# Patient Record
Sex: Female | Born: 1955 | Race: White | Hispanic: No | Marital: Married | Smoking: Former smoker
Health system: Southern US, Community
[De-identification: ages and names within clinical notes are randomized; demographics above are authoritative.]

## PROBLEM LIST (undated history)

## (undated) DIAGNOSIS — I219 Acute myocardial infarction, unspecified: Secondary | ICD-10-CM

## (undated) DIAGNOSIS — F419 Anxiety disorder, unspecified: Secondary | ICD-10-CM

## (undated) DIAGNOSIS — F329 Major depressive disorder, single episode, unspecified: Secondary | ICD-10-CM

## (undated) DIAGNOSIS — F32A Depression, unspecified: Secondary | ICD-10-CM

## (undated) HISTORY — PX: ANKLE SURGERY: SHX546

## (undated) HISTORY — PX: EYE SURGERY: SHX253

## (undated) HISTORY — PX: TONSILLECTOMY: SUR1361

## (undated) HISTORY — PX: TUBAL LIGATION: SHX77

---
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
## 2002-03-14 ENCOUNTER — Encounter: Admission: RE | Admit: 2002-03-14 | Discharge: 2002-03-14 | Payer: Self-pay

## 2002-10-08 ENCOUNTER — Encounter: Admission: RE | Admit: 2002-10-08 | Discharge: 2002-10-08 | Payer: Self-pay | Admitting: Family Medicine

## 2002-10-08 ENCOUNTER — Encounter: Payer: Self-pay | Admitting: Family Medicine

## 2005-12-14 ENCOUNTER — Encounter: Admission: RE | Admit: 2005-12-14 | Discharge: 2005-12-14 | Payer: Self-pay | Admitting: Sports Medicine

## 2006-05-11 ENCOUNTER — Encounter: Admission: RE | Admit: 2006-05-11 | Discharge: 2006-05-11 | Payer: Self-pay | Admitting: Sports Medicine

## 2007-07-01 ENCOUNTER — Emergency Department (HOSPITAL_COMMUNITY): Admission: EM | Admit: 2007-07-01 | Discharge: 2007-07-01 | Payer: Self-pay | Admitting: Emergency Medicine

## 2012-08-30 DIAGNOSIS — R9431 Abnormal electrocardiogram [ECG] [EKG]: Secondary | ICD-10-CM | POA: Insufficient documentation

## 2012-12-08 DIAGNOSIS — M5030 Other cervical disc degeneration, unspecified cervical region: Secondary | ICD-10-CM | POA: Insufficient documentation

## 2013-03-19 DIAGNOSIS — IMO0002 Reserved for concepts with insufficient information to code with codable children: Secondary | ICD-10-CM | POA: Insufficient documentation

## 2013-07-23 DIAGNOSIS — M810 Age-related osteoporosis without current pathological fracture: Secondary | ICD-10-CM | POA: Insufficient documentation

## 2013-09-18 DIAGNOSIS — D472 Monoclonal gammopathy: Secondary | ICD-10-CM | POA: Insufficient documentation

## 2013-10-19 DIAGNOSIS — K760 Fatty (change of) liver, not elsewhere classified: Secondary | ICD-10-CM | POA: Insufficient documentation

## 2013-10-19 DIAGNOSIS — R911 Solitary pulmonary nodule: Secondary | ICD-10-CM | POA: Insufficient documentation

## 2013-10-19 DIAGNOSIS — N281 Cyst of kidney, acquired: Secondary | ICD-10-CM | POA: Insufficient documentation

## 2015-01-12 DIAGNOSIS — H2589 Other age-related cataract: Secondary | ICD-10-CM | POA: Insufficient documentation

## 2015-04-18 ENCOUNTER — Emergency Department
Admission: EM | Admit: 2015-04-18 | Discharge: 2015-04-18 | Disposition: A | Discharge status: Home / Self Care | Payer: BLUE CROSS/BLUE SHIELD | Source: Home / Self Care | Attending: Family Medicine | Admitting: Family Medicine

## 2015-04-18 ENCOUNTER — Encounter: Payer: Self-pay | Admitting: Emergency Medicine

## 2015-04-18 ENCOUNTER — Emergency Department (INDEPENDENT_AMBULATORY_CARE_PROVIDER_SITE_OTHER): Payer: BLUE CROSS/BLUE SHIELD

## 2015-04-18 DIAGNOSIS — S4991XA Unspecified injury of right shoulder and upper arm, initial encounter: Secondary | ICD-10-CM | POA: Diagnosis not present

## 2015-04-18 DIAGNOSIS — M25511 Pain in right shoulder: Secondary | ICD-10-CM

## 2015-04-18 DIAGNOSIS — M75101 Unspecified rotator cuff tear or rupture of right shoulder, not specified as traumatic: Secondary | ICD-10-CM

## 2015-04-18 NOTE — ED Notes (Signed)
Pt states that she fell last night after dinner on her right shoulder.  Pt does have a torn rotator cuff which she is scheduled for surgery on Tuesday for w/Dr Supple.

## 2015-04-18 NOTE — ED Provider Notes (Signed)
CSN: [id number]     Arrival date & time 04/18/15  1400 History   First MD Initiated Contact with Patient 04/18/15 1418     Chief Complaint  Patient presents with  . Shoulder Pain   (Consider location/radiation/quality/duration/timing/severity/associated sxs/prior Treatment) HPI  The pt is a 60yo female presenting to [facility], accompanied by her daughter, with c/o severe Right shoulder pain and deformity after she slipped and fell last night while out at dinner. She believes she slipped on some grease as her feet slid out from under her before she could catch her fall.  Pain is severe, sharp aching, 10/10, significantly decreased ROM. She has been taking ibuprofen, acetaminophen and tramadol but no relief of the pain.  She does have mild numbness along her forearm but believes that is due to the RCT. Denies hitting her head but does have mild Right sided neck pain she believes is radiating from her shoulder.  She reports hx of current torn rotator cuff and is scheduled for surgery on Tuesday, 04/20/15 with Dr. Onnie Graham.   Pt notes she has a number for Dr. Tonita Cong, [facility], who she can call if needed while in [place].   History reviewed. No pertinent past medical history. Past Surgical History  Procedure Laterality Date  . Tubal ligation    . Ankle surgery    . Tonsillectomy    . Eye surgery     Family History  Problem Relation Age of Onset  . Osteoporosis Mother   . Rheum arthritis Mother   . Multiple sclerosis Father   . Heart Problems Brother   . Diabetes Brother    Social History  Substance Use Topics  . Smoking status: Former Research scientist (life sciences)  . Smokeless tobacco: None  . Alcohol Use: No   OB History    No data available     Review of Systems  Musculoskeletal: Positive for myalgias, joint swelling, arthralgias and neck pain (Right side). Negative for gait problem and neck stiffness.  Skin: Negative for color change and wound.  Neurological: Positive for weakness and numbness.     Allergies  Codeine  Home Medications   Prior to Admission medications   Medication Sig Start Date End Date Taking? Authorizing Provider  acetaminophen (TYLENOL) 650 MG CR tablet Take 650 mg by mouth every 8 (eight) hours as needed for pain.   Yes Historical Provider, MD  amitriptyline (ELAVIL) 25 MG tablet Take 25 mg by mouth at bedtime.   Yes Historical Provider, MD  FLUoxetine (PROZAC) 40 MG capsule Take 40 mg by mouth daily.   Yes Historical Provider, MD  hydrochlorothiazide (HYDRODIURIL) 25 MG tablet Take 25 mg by mouth daily.   Yes Historical Provider, MD  traMADol (ULTRAM) 50 MG tablet Take by mouth every 6 (six) hours as needed.   Yes Historical Provider, MD   Meds Ordered and Administered this Visit  Medications - No data to display  BP 144/68 mmHg  Pulse 68  Temp(Src) 98.3 F (36.8 C) (Oral)  Ht 5\' 4"  (1.626 m)  Wt 152 lb 8 oz (69.174 kg)  BMI 26.16 kg/m2  SpO2 91% No data found.   Physical Exam  Constitutional: She is oriented to person, place, and time. She appears well-developed and well-nourished.  HENT:  Head: Normocephalic and atraumatic.  Eyes: EOM are normal.  Neck: Normal range of motion.  Cardiovascular: Normal rate.   Pulses:      Radial pulses are 2+ on the right side.  Pulmonary/Chest: Effort normal.  Musculoskeletal: She  exhibits edema and tenderness.  Right shoulder: anterior deformity/humeral head prominence. Limited ROM. Unable to abduct or adduct her arm more than 5 degrees both directions due to severe pain.   Right elbow: non-tender, full ROM.  4/5 grip strength Right hand compared to Left.   Neurological: She is alert and oriented to person, place, and time.  Skin: Skin is warm and dry.  Right arm: skin in tact. No ecchymosis or erythema.   Psychiatric: She has a normal mood and affect. Her behavior is normal.  Nursing note and vitals reviewed.   ED Course  Procedures (including critical care time)  Labs Review Labs Reviewed - No  data to display  Imaging Review Dg Shoulder Right  04/18/2015  CLINICAL DATA:  Post fall last evening injuring right shoulder. EXAM: RIGHT SHOULDER - 2+ VIEW COMPARISON:  None. FINDINGS: No fracture or dislocation. Glenohumeral and acromioclavicular joint spaces appear preserved. No evidence of calcific tendinitis. Regional soft tissues appear normal. Limited visualization adjacent thorax is normal. IMPRESSION: Normal radiographs of the right shoulder. Electronically Signed   By: Sandi Mariscal M.D.   On: 04/18/2015 14:58      MDM   1. Right shoulder injury, initial encounter   2. Rotator cuff tear, right    Pt c/o Right shoulder pain after a slip and fall last night. 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 No head injury. Exam concerning for anterior shoulder dislocation  Plain films: no signs of dislocation or fracture. Reassured pt.  Pt requesting pain medication stronger than tramadol.  Offered a few days of vicodin or percocet, pt declined stating she cannot have anything with codeine.   She does recall she has Flexeril at home she has not tried yet. Encouraged to try Flexeril and take tramadol as prescribed. May call her orthopedist for additional guidance.  Sling provided in UC.     Noland Fordyce, PA-C 04/18/15 1515

## 2015-04-18 NOTE — Discharge Instructions (Signed)
You may take your tramadol and Flexeril (muscle relaxer) as prescribed to help with pain.  Please be sure to follow up with your orthopedist if additional pain medication is needed.  You may alternate ice and heat such as a heating pad or warm washcloth to help with sore muscles.  Rotator Cuff Injury Rotator cuff injury is any type of injury to the set of muscles and tendons that make up the stabilizing unit of your shoulder. This unit holds the ball of your upper arm bone (humerus) in the socket of your shoulder blade (scapula).  CAUSES Injuries to your rotator cuff most commonly come from sports or activities that cause your arm to be moved repeatedly over your head. Examples of this include throwing, weight lifting, swimming, or racquet sports. Long lasting (chronic) irritation of your rotator cuff can cause soreness and swelling (inflammation), bursitis, and eventual damage to your tendons, such as a tear (rupture). SIGNS AND SYMPTOMS Acute rotator cuff tear:  Sudden tearing sensation followed by severe pain shooting from your upper shoulder down your arm toward your elbow.  Decreased range of motion of your shoulder because of pain and muscle spasm.  Severe pain.  Inability to raise your arm out to the side because of pain and loss of muscle power (large tears). Chronic rotator cuff tear:  Pain that usually is worse at night and may interfere with sleep.  Gradual weakness and decreased shoulder motion as the pain worsens.  Decreased range of motion. Rotator cuff tendinitis:  Deep ache in your shoulder and the outside upper arm over your shoulder.  Pain that comes on gradually and becomes worse when lifting your arm to the side or turning it inward. DIAGNOSIS Rotator cuff injury is diagnosed through a medical history, physical exam, and imaging exam. The medical history helps determine the type of rotator cuff injury. Your health care provider will look at your injured shoulder,  feel the injured area, and ask you to move your shoulder in different positions. X-ray exams typically are done to rule out other causes of shoulder pain, such as fractures. MRI is the exam of choice for the most severe shoulder injuries because the images show muscles and tendons.  TREATMENT  Chronic tear:  Medicine for pain, such as acetaminophen or ibuprofen.  Physical therapy and range-of-motion exercises may be helpful in maintaining shoulder function and strength.  Steroid injections into your shoulder joint.  Surgical repair of the rotator cuff if the injury does not heal with noninvasive treatment. Acute tear:  Anti-inflammatory medicines such as ibuprofen and naproxen to help reduce pain and swelling.  A sling to help support your arm and rest your rotator cuff muscles. Long-term use of a sling is not advised. It may cause significant stiffening of the shoulder joint.  Surgery may be considered within a few weeks, especially in younger, active people, to return the shoulder to full function.  Indications for surgical treatment include the following:  Age younger than 110 years.  Rotator cuff tears that are complete.  Physical therapy, rest, and anti-inflammatory medicines have been used for 6-8 weeks, with no improvement.  Employment or sporting activity that requires constant shoulder use. Tendinitis:  Anti-inflammatory medicines such as ibuprofen and naproxen to help reduce pain and swelling.  A sling to help support your arm and rest your rotator cuff muscles. Long-term use of a sling is not advised. It may cause significant stiffening of the shoulder joint.  Severe tendinitis may require:  Steroid injections  into your shoulder joint.  Physical therapy.  Surgery. HOME CARE INSTRUCTIONS   Apply ice to your injury:  Put ice in a plastic bag.  Place a towel between your skin and the bag.  Leave the ice on for 20 minutes, 2-3 times a day.  If you have a  shoulder immobilizer (sling and straps), wear it until told otherwise by your health care provider.  You may want to sleep on several pillows or in a recliner at night to lessen swelling and pain.  Only take over-the-counter or prescription medicines for pain, discomfort, or fever as directed by your health care provider.  Do simple hand squeezing exercises with a soft rubber ball to decrease hand swelling. SEEK MEDICAL CARE IF:   Your shoulder pain increases, or new pain or numbness develops in your arm, hand, or fingers.  Your hand or fingers are colder than your other hand. SEEK IMMEDIATE MEDICAL CARE IF:   Your arm, hand, or fingers are numb or tingling.  Your arm, hand, or fingers are increasingly swollen and painful, or they turn white or blue. MAKE SURE YOU:  Understand these instructions.  Will watch your condition.  Will get help right away if you are not doing well or get worse.   This information is not intended to replace advice given to you by your health care provider. Make sure you discuss any questions you have with your health care provider.   Document Released: 02/18/2000 Document Revised: 02/25/2013 Document Reviewed: 10/02/2012 Elsevier Interactive Patient Education 2016 [employer].  How to Use a Shoulder Immobilizer A shoulder immobilizer is a device that you may have to wear after a shoulder injury or surgery. This device keeps your arm from moving. This prevents additional pain or injury. It also supports your arm next to your body as your shoulder heals. You may need to wear a shoulder immobilizer to treat a broken bone (fracture) in your shoulder. You may also need to wear one if you have an injury that moves your shoulder out of position (dislocation). There are different types of shoulder immobilizers. The one that you get depends on your injury. RISKS AND COMPLICATIONS Wearing a shoulder immobilizer in the wrong way can let your injured shoulder move  around too much. This may delay healing and make your pain and swelling worse. HOW TO USE YOUR SHOULDER IMMOBILIZER  The part of the immobilizer that goes around your neck (sling) should support your upper arm, with your elbow bent and your lower arm and hand across your chest.  Make sure that your elbow:  Is snug against the back pocket of the sling.  Does not move away from your body.  The strap of the immobilizer should go over your shoulder and support your arm and hand. Your hand should be slightly higher than your elbow. It should not hang loosely over the edge of the sling.  If the long strap has a pad, place it where it is most comfortable on your neck.  Carefully follow your health care provider's instructions for wearing your shoulder immobilizer. Your health care provider may want you to:  Loosen your immobilizer to straighten your elbow and move your wrist and fingers. You may have to do this several times each day. Ask your health care provider when you should do this and how often.  Remove your immobilizer once every day to shower, but limit the movement in your injured arm. Before putting the immobilizer back on, use a towel  to dry the area under your arm completely.  Remove your immobilizer to do shoulder exercises at home as directed by your health care provider.  Wear your immobilizer while you sleep. You may sleep more comfortably if you have your upper body raised on pillows. SEEK MEDICAL CARE IF:  Your immobilizer is not supporting your arm properly.  Your immobilizer gets damaged.  You have worsening pain or swelling in your shoulder, arm, or hand.  Your shoulder, arm, or hand changes color or temperature.  You lose feeling in your shoulder, arm, or hand.   This information is not intended to replace advice given to you by your health care provider. Make sure you discuss any questions you have with your health care provider.   Document Released: 03/30/2004  Document Revised: 07/07/2014 Document Reviewed: 01/28/2014 Elsevier Interactive Patient Education [employer].

## 2016-12-12 DIAGNOSIS — F330 Major depressive disorder, recurrent, mild: Secondary | ICD-10-CM | POA: Insufficient documentation

## 2016-12-12 DIAGNOSIS — E559 Vitamin D deficiency, unspecified: Secondary | ICD-10-CM | POA: Insufficient documentation

## 2016-12-12 DIAGNOSIS — G2581 Restless legs syndrome: Secondary | ICD-10-CM | POA: Insufficient documentation

## 2016-12-12 DIAGNOSIS — F411 Generalized anxiety disorder: Secondary | ICD-10-CM | POA: Insufficient documentation

## 2017-01-18 ENCOUNTER — Ambulatory Visit (INDEPENDENT_AMBULATORY_CARE_PROVIDER_SITE_OTHER): Payer: BLUE CROSS/BLUE SHIELD

## 2017-01-18 ENCOUNTER — Ambulatory Visit: Payer: BLUE CROSS/BLUE SHIELD | Admitting: Podiatry

## 2017-01-18 ENCOUNTER — Other Ambulatory Visit: Payer: Self-pay

## 2017-01-18 DIAGNOSIS — M21619 Bunion of unspecified foot: Secondary | ICD-10-CM

## 2017-01-18 DIAGNOSIS — M2010 Hallux valgus (acquired), unspecified foot: Secondary | ICD-10-CM | POA: Diagnosis not present

## 2017-01-18 DIAGNOSIS — M722 Plantar fascial fibromatosis: Secondary | ICD-10-CM

## 2017-01-18 NOTE — Patient Instructions (Signed)

## 2017-02-08 ENCOUNTER — Ambulatory Visit: Payer: BLUE CROSS/BLUE SHIELD | Admitting: Podiatry

## 2017-03-07 ENCOUNTER — Other Ambulatory Visit: Payer: Self-pay | Admitting: Podiatry

## 2017-03-07 DIAGNOSIS — M722 Plantar fascial fibromatosis: Secondary | ICD-10-CM

## 2017-03-25 NOTE — Progress Notes (Signed)
  Subjective:  Patient ID: Stacy Sweeney, female    DOB: 1955-12-03,  MRN: [id number]  Chief Complaint  Patient presents with  . Foot Pain    left foot, bottom of heel x 6 months. Right, lateral side, pain radiates up ankle.  . Bunions    B/L - left foot hurts   . Ankle Pain    Left, medial side.  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    62 y.o. female presents with the above complaint.  Reports pain to the bottom of the L heel. Worst in AM. Also reports pain to the outside of R foot, radiates up the lateral side. Reports painful bunion bilat. 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 No past medical history on file. Past Surgical History:  Procedure Laterality Date  . ANKLE SURGERY    . EYE SURGERY    . TONSILLECTOMY    . TUBAL LIGATION      Current Outpatient Medications:  .  acetaminophen (TYLENOL) 650 MG CR tablet, Take 650 mg by mouth every 8 (eight) hours as needed for pain., Disp: , Rfl:  .  amitriptyline (ELAVIL) 25 MG tablet, Take 25 mg by mouth at bedtime., Disp: , Rfl:  .  FLUoxetine (PROZAC) 40 MG capsule, Take 40 mg by mouth daily., Disp: , Rfl:  .  hydrochlorothiazide (HYDRODIURIL) 25 MG tablet, Take 25 mg by mouth daily., Disp: , Rfl:  .  traMADol (ULTRAM) 50 MG tablet, Take by mouth every 6 (six) hours as needed., Disp: , Rfl:   Allergies  Allergen Reactions  . Codeine    Review of Systems Objective:  There were no vitals filed for this visit. General AA&O x3. Normal mood and affect.  Vascular Dorsalis pedis and posterior tibial pulses  present 2+ bilaterally  Capillary refill normal to all digits. Pedal hair growth normal.  Neurologic Epicritic sensation grossly present.  Dermatologic No open lesions. Interspaces clear of maceration. Nails well groomed and normal in appearance.  Orthopedic: MMT 5/5 in dorsiflexion, plantarflexion, inversion, and eversion. Normal joint ROM without pain or crepitus. POP L medial calcaneal  tuber. No pain with calcaneal squeeze.  Radiograps taken and reviwed. No acute fractures or dislocations. HAV deformity. Calcaneal spurring ntoed. Assessment & Plan:  Patient was evaluated and treated and all questions answered.  Plantar Fasciitis, left - XR reviewed as above.  - Educated on icing and stretching. Instructions given.  - Injection delivered to the plantar fascia as below.  Procedure: Injection Tendon/Ligament Location: Left plantar fascia at the glabrous junction; medial approach. Skin Prep: Alcohol. Injectate: 1 cc 0.5% marcaine plain, 1 cc dexamethasone phosphate, 0.5 cc kenalog 10. Disposition: Patient tolerated procedure well. Injection site dressed with a band-aid.  Bunions B/l -Educated on padding, shoe gear changes.  Return in about 3 weeks (around 02/08/2017).

## 2017-04-02 ENCOUNTER — Ambulatory Visit: Payer: BLUE CROSS/BLUE SHIELD | Admitting: Podiatry

## 2018-09-06 ENCOUNTER — Emergency Department (HOSPITAL_BASED_OUTPATIENT_CLINIC_OR_DEPARTMENT_OTHER): Payer: BLUE CROSS/BLUE SHIELD

## 2018-09-06 ENCOUNTER — Inpatient Hospital Stay (HOSPITAL_BASED_OUTPATIENT_CLINIC_OR_DEPARTMENT_OTHER)
Admission: EM | Admit: 2018-09-06 | Discharge: 2018-09-08 | DRG: 313 | Disposition: A | Discharge status: Home / Self Care | Payer: BLUE CROSS/BLUE SHIELD | Attending: Internal Medicine | Admitting: Internal Medicine

## 2018-09-06 ENCOUNTER — Other Ambulatory Visit: Payer: Self-pay

## 2018-09-06 ENCOUNTER — Encounter (HOSPITAL_BASED_OUTPATIENT_CLINIC_OR_DEPARTMENT_OTHER): Payer: Self-pay

## 2018-09-06 DIAGNOSIS — D472 Monoclonal gammopathy: Secondary | ICD-10-CM | POA: Diagnosis present

## 2018-09-06 DIAGNOSIS — R918 Other nonspecific abnormal finding of lung field: Secondary | ICD-10-CM | POA: Diagnosis present

## 2018-09-06 DIAGNOSIS — Z8261 Family history of arthritis: Secondary | ICD-10-CM

## 2018-09-06 DIAGNOSIS — Z8249 Family history of ischemic heart disease and other diseases of the circulatory system: Secondary | ICD-10-CM | POA: Diagnosis not present

## 2018-09-06 DIAGNOSIS — F411 Generalized anxiety disorder: Secondary | ICD-10-CM | POA: Diagnosis present

## 2018-09-06 DIAGNOSIS — R0789 Other chest pain: Principal | ICD-10-CM | POA: Diagnosis present

## 2018-09-06 DIAGNOSIS — I248 Other forms of acute ischemic heart disease: Secondary | ICD-10-CM | POA: Diagnosis present

## 2018-09-06 DIAGNOSIS — N2889 Other specified disorders of kidney and ureter: Secondary | ICD-10-CM

## 2018-09-06 DIAGNOSIS — R519 Headache, unspecified: Secondary | ICD-10-CM

## 2018-09-06 DIAGNOSIS — R739 Hyperglycemia, unspecified: Secondary | ICD-10-CM | POA: Diagnosis present

## 2018-09-06 DIAGNOSIS — R51 Headache: Secondary | ICD-10-CM | POA: Diagnosis present

## 2018-09-06 DIAGNOSIS — Z20828 Contact with and (suspected) exposure to other viral communicable diseases: Secondary | ICD-10-CM | POA: Diagnosis present

## 2018-09-06 DIAGNOSIS — Z8262 Family history of osteoporosis: Secondary | ICD-10-CM

## 2018-09-06 DIAGNOSIS — I214 Non-ST elevation (NSTEMI) myocardial infarction: Secondary | ICD-10-CM

## 2018-09-06 DIAGNOSIS — R079 Chest pain, unspecified: Secondary | ICD-10-CM

## 2018-09-06 DIAGNOSIS — I10 Essential (primary) hypertension: Secondary | ICD-10-CM | POA: Diagnosis present

## 2018-09-06 DIAGNOSIS — Z87891 Personal history of nicotine dependence: Secondary | ICD-10-CM | POA: Diagnosis not present

## 2018-09-06 DIAGNOSIS — I2510 Atherosclerotic heart disease of native coronary artery without angina pectoris: Secondary | ICD-10-CM | POA: Diagnosis present

## 2018-09-06 DIAGNOSIS — Z833 Family history of diabetes mellitus: Secondary | ICD-10-CM

## 2018-09-06 DIAGNOSIS — R9431 Abnormal electrocardiogram [ECG] [EKG]: Secondary | ICD-10-CM | POA: Diagnosis present

## 2018-09-06 DIAGNOSIS — I361 Nonrheumatic tricuspid (valve) insufficiency: Secondary | ICD-10-CM | POA: Diagnosis not present

## 2018-09-06 DIAGNOSIS — R0602 Shortness of breath: Secondary | ICD-10-CM

## 2018-09-06 DIAGNOSIS — R778 Other specified abnormalities of plasma proteins: Secondary | ICD-10-CM

## 2018-09-06 DIAGNOSIS — R7989 Other specified abnormal findings of blood chemistry: Secondary | ICD-10-CM | POA: Diagnosis not present

## 2018-09-06 DIAGNOSIS — F329 Major depressive disorder, single episode, unspecified: Secondary | ICD-10-CM | POA: Diagnosis present

## 2018-09-06 DIAGNOSIS — Z820 Family history of epilepsy and other diseases of the nervous system: Secondary | ICD-10-CM | POA: Diagnosis not present

## 2018-09-06 HISTORY — DX: Anxiety disorder, unspecified: F41.9

## 2018-09-06 HISTORY — DX: Depression, unspecified: F32.A

## 2018-09-06 LAB — TROPONIN I (HIGH SENSITIVITY)
Troponin I (High Sensitivity): 36 ng/L — ABNORMAL HIGH (ref <18)
Troponin I (High Sensitivity): 61 ng/L — ABNORMAL HIGH (ref <18)

## 2018-09-06 LAB — CBC WITH DIFFERENTIAL/PLATELET
Abs Immature Granulocytes: 0.03 10*3/uL (ref 0.00–0.07)
Basophils Absolute: 0.1 10*3/uL (ref 0.0–0.1)
Basophils Relative: 1 %
Eosinophils Absolute: 0.2 10*3/uL (ref 0.0–0.5)
Eosinophils Relative: 2 %
HCT: 37 % (ref 36.0–46.0)
Hemoglobin: 12 g/dL (ref 12.0–15.0)
Immature Granulocytes: 0 %
Lymphocytes Relative: 30 %
Lymphs Abs: 2.8 10*3/uL (ref 0.7–4.0)
MCH: 28.6 pg (ref 26.0–34.0)
MCHC: 32.4 g/dL (ref 30.0–36.0)
MCV: 88.3 fL (ref 80.0–100.0)
Monocytes Absolute: 1.1 10*3/uL — ABNORMAL HIGH (ref 0.1–1.0)
Monocytes Relative: 11 %
Neutro Abs: 5.2 10*3/uL (ref 1.7–7.7)
Neutrophils Relative %: 56 %
Platelets: 377 10*3/uL (ref 150–400)
RBC: 4.19 MIL/uL (ref 3.87–5.11)
RDW: 12.4 % (ref 11.5–15.5)
WBC: 9.3 10*3/uL (ref 4.0–10.5)
nRBC: 0 % (ref 0.0–0.2)

## 2018-09-06 LAB — BASIC METABOLIC PANEL
Anion gap: 14 (ref 5–15)
BUN: 19 mg/dL (ref 8–23)
CO2: 22 mmol/L (ref 22–32)
Calcium: 9.8 mg/dL (ref 8.9–10.3)
Chloride: 107 mmol/L (ref 98–111)
Creatinine, Ser: 0.77 mg/dL (ref 0.44–1.00)
GFR calc Af Amer: >60 mL/min (ref >60)
GFR calc non Af Amer: >60 mL/min (ref >60)
Glucose, Bld: 104 mg/dL — ABNORMAL HIGH (ref 70–99)
Potassium: 4.1 mmol/L (ref 3.5–5.1)
Sodium: 143 mmol/L (ref 135–145)

## 2018-09-06 LAB — SARS CORONAVIRUS 2 AG (30 MIN TAT): SARS Coronavirus 2 Ag: NEGATIVE

## 2018-09-06 MED ORDER — ENOXAPARIN SODIUM 80 MG/0.8ML ~~LOC~~ SOLN
1.0000 mg/kg | Freq: Once | SUBCUTANEOUS | Status: AC
  Administered 2018-09-06: 65 mg via SUBCUTANEOUS
  Filled 2018-09-06: qty 0.8

## 2018-09-06 MED ORDER — NITROGLYCERIN 0.4 MG SL SUBL
0.4000 mg | SUBLINGUAL_TABLET | SUBLINGUAL | Status: DC | PRN
  Administered 2018-09-07: 0.4 mg via SUBLINGUAL
  Filled 2018-09-06: qty 1

## 2018-09-06 MED ORDER — ASPIRIN 81 MG PO CHEW
324.0000 mg | CHEWABLE_TABLET | Freq: Once | ORAL | Status: AC
  Administered 2018-09-06: 324 mg via ORAL
  Filled 2018-09-06: qty 4

## 2018-09-06 MED ORDER — DIPHENHYDRAMINE HCL 50 MG/ML IJ SOLN
25.0000 mg | Freq: Once | INTRAMUSCULAR | Status: AC
  Administered 2018-09-06: 25 mg via INTRAVENOUS
  Filled 2018-09-06: qty 1

## 2018-09-06 MED ORDER — METOCLOPRAMIDE HCL 5 MG/ML IJ SOLN
10.0000 mg | Freq: Once | INTRAMUSCULAR | Status: AC
  Administered 2018-09-06: 18:00:00 10 mg via INTRAVENOUS
  Filled 2018-09-06: qty 2

## 2018-09-06 MED ORDER — LORAZEPAM 2 MG/ML IJ SOLN
1.0000 mg | Freq: Once | INTRAMUSCULAR | Status: AC
  Administered 2018-09-06: 1 mg via INTRAVENOUS
  Filled 2018-09-06: qty 1

## 2018-09-06 MED ORDER — ACETAMINOPHEN 500 MG PO TABS
1000.0000 mg | ORAL_TABLET | Freq: Four times a day (QID) | ORAL | Status: DC | PRN
  Administered 2018-09-07 (×3): 1000 mg via ORAL
  Filled 2018-09-06 (×3): qty 2

## 2018-09-06 MED ORDER — SODIUM CHLORIDE 0.9 % IV BOLUS
1000.0000 mL | Freq: Once | INTRAVENOUS | Status: AC
  Administered 2018-09-06: 1000 mL via INTRAVENOUS

## 2018-09-06 MED ORDER — IOHEXOL 350 MG/ML SOLN
100.0000 mL | Freq: Once | INTRAVENOUS | Status: AC | PRN
  Administered 2018-09-06: 63 mL via INTRAVENOUS

## 2018-09-06 NOTE — ED Notes (Signed)
Report given to [place] at [facility]

## 2018-09-06 NOTE — ED Notes (Signed)
C/o dizziness, chest tightness pain to shoulders, back and ha

## 2018-09-06 NOTE — H&P (Signed)
History and Physical    Stacy S[facility] XIP:[id number] DOB: 12/15/1955 DOA: 09/06/2018  PCP: System, Pcp Not In Patient coming from: [facility]  Chief Complaint: Chest pain  HPI: Stacy S[facility] is a 63 y.o. female with medical history significant of anxiety, depression, bradycardia, QTC prolongation presenting as a transfer from [facility] for management of chest pain.  Patient states she is physically very active and walks 4 miles daily.  Today she walked 5 miles and then went to [employer].  At [employer] all of a sudden she experienced substernal pressure-like chest pain, shortness of breath, nausea, dizziness, and felt like her entire body was numb.  She thought her symptoms were related to wearing a mask.  States symptoms resolved after she received an anxiety medication in the ED.  Denies prior history of coronary artery disease.  States she had a stress test done 5 years ago which was abnormal and subsequently cardiac catheterization was done which she was told was clean.  She normally never experiences exertional chest pain.  Does state her grandmother had a massive heart attack in her early 42s.  She is a former 40-pack-year smoker, quit 9 years ago.  ED Course:  Hemodynamically stable Hemoglobin 12.0, hematocrit 37.0, white count 9.3, platelet count 377,000 Sodium 143, potassium 4.1, chloride 107, bicarb 23, BUN 19, creatinine 1.7, glucose 104 COVID-19 rapid test negative. Chest pain resolved after Ativan but high-sensitivity troponin elevated 36 >61. EKG with sinus rhythm, baseline wander, and nonspecific T wave abnormality.  No STEMI. Chest x-ray personally reviewed showing no consolidation, pleural effusion, or pneumothorax. Chest CT angiogram negative for PE.  Showing an exophytic indeterminate 2.5 cm renal cortical lesion in the posterior upper left kidney, cannot exclude renal cell carcinoma.  Scattered solid pulmonary nodules in both lungs, largest 7 mm.  Noncontrast chest CT at 3 to 6 months  is recommended. CT head negative for acute finding.` Received aspirin 324 mg.  Case was discussed with Dr. Caryl Comes from cardiology who recommended giving Lovenox, did not recommend heparin.  Patient transferred to [facility] for further management.  Review of Systems:  All systems reviewed and apart from history of presenting illness, are negative.  Past Medical History:  Diagnosis Date  . Anxiety   . Depression     Past Surgical History:  Procedure Laterality Date  . ANKLE SURGERY    . EYE SURGERY    . TONSILLECTOMY    . TUBAL LIGATION       reports that she has quit smoking. She has never used smokeless tobacco. She reports that she does not drink alcohol or use drugs.  No Active Allergies  Family History  Problem Relation Age of Onset  . Osteoporosis Mother   . Rheum arthritis Mother   . Multiple sclerosis Father   . Heart Problems Brother   . Diabetes Brother     Prior to Admission medications   Medication Sig Start Date End Date Taking? Authorizing Provider  acetaminophen (TYLENOL) 650 MG CR tablet Take 650 mg by mouth every 8 (eight) hours as needed for pain.    [provider]  amitriptyline (ELAVIL) 25 MG tablet Take 25 mg by mouth at bedtime.    [provider]  FLUoxetine (PROZAC) 40 MG capsule Take 40 mg by mouth daily.    [provider]  hydrochlorothiazide (HYDRODIURIL) 25 MG tablet Take 25 mg by mouth daily.    [provider]  traMADol (ULTRAM) 50 MG tablet Take by mouth every  6 (six) hours as needed.    [provider]    Physical Exam: Vitals:   09/06/18 1900 09/06/18 2000 09/06/18 2100 09/06/18 2152  BP: (!) 128/49 140/61 (!) 157/61 (!) 123/52  Pulse:  62 68 66  Resp: (!) 22 20 19    Temp:    98.5 F (36.9 C)  TempSrc:    Oral  SpO2:  96% 99% 100%  Weight:    62.1 kg  Height:    5\' 4"  (1.626 m)    Physical Exam  Constitutional: She is oriented to person, place, and time. She appears well-developed and  well-nourished. No distress.  HENT:  Head: Normocephalic.  Mouth/Throat: Oropharynx is clear and moist.  Eyes: Right eye exhibits no discharge. Left eye exhibits no discharge.  Neck: Neck supple.  Cardiovascular: Normal rate, regular rhythm and intact distal pulses.  Pulmonary/Chest: Effort normal and breath sounds normal. No respiratory distress. She has no wheezes. She has no rales.  Abdominal: Soft. Bowel sounds are normal. She exhibits no distension. There is no abdominal tenderness. There is no guarding.  Musculoskeletal:        General: No edema.  Neurological: She is alert and oriented to person, place, and time.  Skin: Skin is warm and dry. She is not diaphoretic.    Labs on Admission: I have personally reviewed following labs and imaging studies  CBC: Recent Labs  Lab 09/06/18 1429  WBC 9.3  NEUTROABS 5.2  HGB 12.0  HCT 37.0  MCV 88.3  PLT 443   Basic Metabolic Panel: Recent Labs  Lab 09/06/18 1429  NA 143  K 4.1  CL 107  CO2 22  GLUCOSE 104*  BUN 19  CREATININE 0.77  CALCIUM 9.8   GFR: Estimated Creatinine Clearance: 62.2 mL/min (by C-G formula based on SCr of 0.77 mg/dL). Liver Function Tests: No results for input(s): AST, ALT, ALKPHOS, BILITOT, PROT, ALBUMIN in the last 168 hours. No results for input(s): LIPASE, AMYLASE in the last 168 hours. No results for input(s): AMMONIA in the last 168 hours. Coagulation Profile: No results for input(s): INR, PROTIME in the last 168 hours. Cardiac Enzymes: No results for input(s): CKTOTAL, CKMB, CKMBINDEX, TROPONINI in the last 168 hours. BNP (last 3 results) No results for input(s): PROBNP in the last 8760 hours. HbA1C: No results for input(s): HGBA1C in the last 72 hours. CBG: No results for input(s): GLUCAP in the last 168 hours. Lipid Profile: No results for input(s): CHOL, HDL, LDLCALC, TRIG, CHOLHDL, LDLDIRECT in the last 72 hours. Thyroid Function Tests: No results for input(s): TSH, T4TOTAL,  FREET4, T3FREE, THYROIDAB in the last 72 hours. Anemia Panel: No results for input(s): VITAMINB12, FOLATE, FERRITIN, TIBC, IRON, RETICCTPCT in the last 72 hours. Urine analysis: No results found for: COLORURINE, APPEARANCEUR, LABSPEC, [place], GLUCOSEU, HGBUR, BILIRUBINUR, KETONESUR, PROTEINUR, UROBILINOGEN, NITRITE, LEUKOCYTESUR  Radiological Exams on Admission: Ct Head Wo Contrast  Result Date: 09/06/2018 CLINICAL DATA:  Severe headache.  Dizziness and nausea.  No trauma. EXAM: CT HEAD WITHOUT CONTRAST TECHNIQUE: Contiguous axial images were obtained from the base of the skull through the vertex without intravenous contrast. COMPARISON:  07/01/2007 FINDINGS: Brain: Mild cerebral atrophy. Cerebellar atrophy is felt to be mildly age advanced. No mass lesion, hemorrhage, hydrocephalus, acute infarct, intra-axial, or extra-axial fluid collection. Vascular: No hyperdense vessel or unexpected calcification. Skull: Normal.  Minimal motion degradation inferiorly. Sinuses/Orbits: Normal imaged portions of the orbits and globes. Clear paranasal sinuses and mastoid air cells. Other: None. IMPRESSION: 1.  No  acute intracranial abnormality. 2. Mild cerebral and cerebellar atrophy for age. Electronically Signed   By: Abigail Miyamoto M.D.   On: 09/06/2018 16:43   Ct Angio Chest Pe W And/or Wo Contrast  Result Date: 09/06/2018 CLINICAL DATA:  Chest and upper back pain.  Dyspnea. EXAM: CT ANGIOGRAPHY CHEST WITH CONTRAST TECHNIQUE: Multidetector CT imaging of the chest was performed using the standard protocol during bolus administration of intravenous contrast. Multiplanar CT image reconstructions and MIPs were obtained to evaluate the vascular anatomy. CONTRAST:  76mL OMNIPAQUE IOHEXOL 350 MG/ML SOLN COMPARISON:  Chest radiograph from earlier today. FINDINGS: Cardiovascular: The study is high quality for the evaluation of pulmonary embolism. There are no filling defects in the central, lobar, segmental or subsegmental  pulmonary artery branches to suggest acute pulmonary embolism. Mildly atherosclerotic nonaneurysmal thoracic aorta. Normal caliber pulmonary arteries. Normal heart size. No significant pericardial fluid/thickening. Mediastinum/Nodes: No discrete thyroid nodules. Unremarkable esophagus. No pathologically enlarged axillary, mediastinal or hilar lymph nodes. Lungs/Pleura: No pneumothorax. No pleural effusion. Mild centrilobular and paraseptal emphysema with mild diffuse bronchial wall thickening. No acute consolidative airspace disease or lung masses. Several scattered solid pulmonary nodules in both lungs, largest 7 mm in the left lower lobe (series 6/image 73). Hypoventilatory changes in the dependent lower lobes. Upper abdomen: Exophytic hypodense 2.5 cm renal cortical lesion in the posterior upper left kidney (series 4/image 99) with density 38 HU. Musculoskeletal: No aggressive appearing focal osseous lesions. Mild thoracic spondylosis. Review of the MIP images confirms the above findings. IMPRESSION: 1. No pulmonary embolism. 2. Exophytic indeterminate 2.5 cm renal cortical lesion in the posterior upper left kidney, cannot exclude renal cell carcinoma. Renal mass protocol MRI (preferred) or CT abdomen without and with IV contrast recommended for further characterization, which may be performed on a short term outpatient basis. 3. Scattered solid pulmonary nodules in both lungs, largest 7 mm. Non-contrast chest CT at 3-6 months is recommended. If the nodules are stable at time of repeat CT, then future CT at 18-24 months (from today's scan) is considered optional for low-risk patients, but is recommended for high-risk patients. This recommendation follows the consensus statement: Guidelines for Management of Incidental Pulmonary Nodules Detected on CT Images: From the Fleischner Society 2017; Radiology 2017; 284:228-243. Aortic Atherosclerosis (ICD10-I70.0) and Emphysema (ICD10-J43.9). Electronically Signed   By:  Ilona Sorrel M.D.   On: 09/06/2018 16:58   Dg Chest Port 1 View  Result Date: 09/06/2018 CLINICAL DATA:  Patient with shortness of breath. EXAM: PORTABLE CHEST 1 VIEW COMPARISON:  None. FINDINGS: Normal cardiac and mediastinal contours. No consolidative pulmonary opacities. No pleural effusion or pneumothorax. IMPRESSION: No acute cardiopulmonary process. Electronically Signed   By: Lovey Newcomer M.D.   On: 09/06/2018 14:39    EKG: Independently reviewed. Sinus rhythm, baseline wander, and nonspecific T wave abnormality.  No STEMI.  Assessment/Plan Principal Problem:   NSTEMI (non-ST elevated myocardial infarction) ([place]) Active Problems:   Renal mass   Pulmonary nodules  NSTEMI Hemodynamically stable and currently chest pain-free.  Risk factors for coronary artery disease include age, prior tobacco use, and family history.  Although there could be a component of anxiety as chest pain resolved after receiving Ativan, however, remains concern for ACS given elevated high-sensitivity troponin 36 >61.  EKG not suggestive of ACS. Chest x-ray personally reviewed showing no consolidation, pleural effusion, or pneumothorax. Chest CT angiogram negative for PE. -Cardiac monitoring -Received aspirin 324 mg in the ED -Cardiology recommended Lovenox for full dose anticoagulation -Trend troponin -Echocardiogram -  Sublingual nitroglycerin as needed -Lipid panel -EKG PRN recurrence of chest pain  Renal mass CT showing an exophytic indeterminate 2.5 cm renal cortical lesion in the posterior upper left kidney, cannot exclude renal cell carcinoma. -MRI abdomen for further evaluation of renal mass  Pulmonary nodules CT showing scattered solid pulmonary nodules in both lungs, largest 7 mm.  Patient is high risk given prior history of tobacco use. -Follow-up noncontrast chest CT at 3 to 6 months per Fleischner Society recommendation.  HIV screening The patient falls between the ages of 13-64 and should be  screened for HIV, therefore HIV testing ordered.  Unable to safely order home medications at this time as pharmacy medication reconciliation is pending.  DVT prophylaxis: Lovenox Code Status: Full code Family Communication: No family available at this time. Disposition Plan: Anticipate discharge after clinical improvement. Admission status: It is my clinical opinion that admission to INPATIENT is reasonable and necessary in this 63 y.o. female . presenting with symptoms of chest pain, concerning for NSTEMI.  Currently on full dose anticoagulation.  Cardiology evaluation pending.  Given the aforementioned, the predictability of an adverse outcome is felt to be significant. I expect that the patient will require at least 2 midnights in the [facility] to treat this condition.   The medical decision making on this patient was of high complexity and the patient is at high risk for clinical deterioration, therefore this is a level 3 visit.  Shela Leff MD [facility] Pager [phone]  If 7PM-7AM, please contact night-coverage [email or web address] Password [email or web address]  09/06/2018, 11:35 PM

## 2018-09-06 NOTE — ED Notes (Signed)
[facility] called regarding consult for cardiology

## 2018-09-06 NOTE — ED Triage Notes (Signed)
Pt was assisted from car to w/c screaming "I can't breathe"/hysterical-was able to calm pt-pt started taking deep breaths-states she was at [facility] P[facility] with sudden onset of a HA-stating she had on a mask and felt she could not breathe-pt denies injury to head-later c/o chest tightness and tingling to hands-advised her hyperventilating can cause tingling to hands-EKG to be done

## 2018-09-06 NOTE — ED Provider Notes (Signed)
[facility] EMERGENCY DEPARTMENT Provider Note   CSN: [id number] Arrival date & time: 09/06/18  1219    History   Chief Complaint Chief Complaint  Patient presents with   Headache   Chest Pain    HPI Stacy TAMILA GAULIN is a 63 y.o. 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  Patient reports that she was at [employer] at noon today when she was wearing a mask due to the current COVID-19 pandemic and began feeling "blood oxygen dropping".  Patient reports that after failing her blood oxygen level drop she became extremely anxious and developed chest pain, shortness of breath and a headache.  She reports all of these began simultaneously after her blood oxygen level dropped and she was brought to the emergency department.  Patient reports that her symptoms are due to her anxiety however she is very concerned for a possible heart attack she describes her chest pain and shortness of breath as a waxing and waning tightness constant without aggravating or alleviating factors, she has not taken any medication prior to arrival.  Patient reports that she takes Prozac daily and has recently had her dose reduced from 40 mg daily to 20 mg daily.     HPI  Past Medical History:  Diagnosis Date   Anxiety    Depression     Patient Active Problem List   Diagnosis Date Noted   Chest pain 09/06/2018   GAD (generalized anxiety disorder) 12/12/2016   Mild episode of recurrent major depressive disorder ([place]) 12/12/2016   Restless leg syndrome 12/12/2016   Vitamin D deficiency 12/12/2016   Other and combined forms of senile cataract 01/12/2015   Pulmonary nodule 10/19/2013   Fatty liver 10/19/2013   Renal cyst 10/19/2013   IgM monoclonal gammopathy of uncertain significance 09/18/2013   Osteoporosis 07/23/2013   Intervertebral disc protrusion 03/19/2013   DDD  (degenerative disc disease), cervical 12/08/2012   Prolonged Q-T interval on ECG 08/30/2012    Past Surgical History:  Procedure Laterality Date   ANKLE SURGERY     EYE SURGERY     TONSILLECTOMY     TUBAL LIGATION       OB History   No obstetric history on file.      Home Medications    Prior to Admission medications   Medication Sig Start Date End Date Taking? Authorizing Provider  acetaminophen (TYLENOL) 650 MG CR tablet Take 650 mg by mouth every 8 (eight) hours as needed for pain.    [provider]  amitriptyline (ELAVIL) 25 MG tablet Take 25 mg by mouth at bedtime.    [provider]  FLUoxetine (PROZAC) 40 MG capsule Take 40 mg by mouth daily.    [provider]  hydrochlorothiazide (HYDRODIURIL) 25 MG tablet Take 25 mg by mouth daily.    [provider]  traMADol (ULTRAM) 50 MG tablet Take by mouth every 6 (six) hours as needed.    [provider]    Family History Family History  Problem Relation Age of Onset   Osteoporosis Mother    Rheum arthritis Mother    Multiple sclerosis Father    Heart Problems Brother    Diabetes Brother     Social History Social History   Tobacco Use   Smoking status: Former Smoker   Smokeless tobacco: Never Used  Substance Use Topics   Alcohol use: No   Drug use:  Never     Allergies   Patient has no active allergies.   Review of Systems Review of Systems  Constitutional: Negative.  Negative for chills and fever.  Eyes: Negative.  Negative for visual disturbance.  Respiratory: Positive for shortness of breath.   Cardiovascular: Positive for chest pain.  Gastrointestinal: Negative.  Negative for abdominal pain, diarrhea, nausea and vomiting.  Neurological: Positive for headaches.  Psychiatric/Behavioral: Negative for suicidal ideas. The patient is nervous/anxious.   All other systems reviewed and are negative.  Physical Exam Updated Vital Signs BP 140/61     Pulse 62    Temp 97.9 F (36.6 C) (Oral)    Resp 20    Ht 5\' 3"  (1.6 m)    Wt 65.3 kg    SpO2 96%    BMI 25.51 kg/m   Physical Exam Constitutional:      General: She is not in acute distress.    Appearance: Normal appearance. She is well-developed. She is not ill-appearing or diaphoretic.  HENT:     Head: Normocephalic and atraumatic.     Jaw: There is normal jaw occlusion.     Right Ear: External ear normal.     Left Ear: External ear normal.     Nose: Nose normal.     Mouth/Throat:     Mouth: Mucous membranes are moist.     Pharynx: Oropharynx is clear.  Eyes:     General: Vision grossly intact. Gaze aligned appropriately.     Extraocular Movements: Extraocular movements intact.     Conjunctiva/sclera: Conjunctivae normal.     Pupils: Pupils are equal, round, and reactive to light.  Neck:     Musculoskeletal: Full passive range of motion without pain, normal range of motion and neck supple.     Trachea: Trachea and phonation normal. No tracheal tenderness or tracheal deviation.     Meningeal: Brudzinski's sign absent.  Cardiovascular:     Rate and Rhythm: Normal rate and regular rhythm.     Pulses:          Radial pulses are 2+ on the right side and 2+ on the left side.       Dorsalis pedis pulses are 2+ on the right side and 2+ on the left side.     Heart sounds: Normal heart sounds.  Pulmonary:     Effort: Pulmonary effort is normal. No accessory muscle usage or respiratory distress.     Breath sounds: Normal breath sounds and air entry.  Chest:     Chest wall: No deformity, tenderness or crepitus.  Abdominal:     General: There is no distension.     Palpations: Abdomen is soft.     Tenderness: There is no abdominal tenderness. There is no guarding or rebound.  Musculoskeletal: Normal range of motion.     Right lower leg: Normal. She exhibits no tenderness. No edema.     Left lower leg: Normal. She exhibits no tenderness. No edema.  Feet:     Right foot:      Protective Sensation: 3 sites tested. 3 sites sensed.     Left foot:     Protective Sensation: 3 sites tested. 3 sites sensed.  Skin:    General: Skin is warm and dry.  Neurological:     Mental Status: She is alert.     GCS: GCS eye subscore is 4. GCS verbal subscore is 5. GCS motor subscore is 6.     Comments: Speech is clear and  goal oriented, follows commands Major Cranial nerves without deficit, no facial droop Moves extremities without ataxia, coordination intact  Psychiatric:        Behavior: Behavior normal.    ED Treatments / Results  Labs (all labs ordered are listed, but only abnormal results are displayed) Labs Reviewed  BASIC METABOLIC PANEL - Abnormal; Notable for the following components:      Result Value   Glucose, Bld 104 (*)    All other components within normal limits  TROPONIN I (HIGH SENSITIVITY) - Abnormal; Notable for the following components:   Troponin I (High Sensitivity) 36 (*)    All other components within normal limits  TROPONIN I (HIGH SENSITIVITY) - Abnormal; Notable for the following components:   Troponin I (High Sensitivity) 61 (*)    All other components within normal limits  CBC WITH DIFFERENTIAL/PLATELET - Abnormal; Notable for the following components:   Monocytes Absolute 1.1 (*)    All other components within normal limits  SARS CORONAVIRUS 2 (HOSP ORDER, PERFORMED IN [facility] VIA ABBOTT ID)    EKG EKG Interpretation  Date/Time:  Friday September 06 2018 12:35:41 EDT Ventricular Rate:  80 PR Interval:  134 QRS Duration: 82 QT Interval:  402 QTC Calculation: 463 R Axis:   58 Text Interpretation:  Normal sinus rhythm Right atrial enlargement Nonspecific ST and T wave abnormality Abnormal ECG Confirmed by Elnora Morrison [phone]) on 09/06/2018 3:45:24 PM   Radiology Ct Head Wo Contrast  Result Date: 09/06/2018 CLINICAL DATA:  Severe headache.  Dizziness and nausea.  No trauma. EXAM: CT HEAD WITHOUT CONTRAST TECHNIQUE: Contiguous  axial images were obtained from the base of the skull through the vertex without intravenous contrast. COMPARISON:  07/01/2007 FINDINGS: Brain: Mild cerebral atrophy. Cerebellar atrophy is felt to be mildly age advanced. No mass lesion, hemorrhage, hydrocephalus, acute infarct, intra-axial, or extra-axial fluid collection. Vascular: No hyperdense vessel or unexpected calcification. Skull: Normal.  Minimal motion degradation inferiorly. Sinuses/Orbits: Normal imaged portions of the orbits and globes. Clear paranasal sinuses and mastoid air cells. Other: None. IMPRESSION: 1.  No acute intracranial abnormality. 2. Mild cerebral and cerebellar atrophy for age. Electronically Signed   By: Abigail Miyamoto M.D.   On: 09/06/2018 16:43   Ct Angio Chest Pe W And/or Wo Contrast  Result Date: 09/06/2018 CLINICAL DATA:  Chest and upper back pain.  Dyspnea. EXAM: CT ANGIOGRAPHY CHEST WITH CONTRAST TECHNIQUE: Multidetector CT imaging of the chest was performed using the standard protocol during bolus administration of intravenous contrast. Multiplanar CT image reconstructions and MIPs were obtained to evaluate the vascular anatomy. CONTRAST:  85mL OMNIPAQUE IOHEXOL 350 MG/ML SOLN COMPARISON:  Chest radiograph from earlier today. FINDINGS: Cardiovascular: The study is high quality for the evaluation of pulmonary embolism. There are no filling defects in the central, lobar, segmental or subsegmental pulmonary artery branches to suggest acute pulmonary embolism. Mildly atherosclerotic nonaneurysmal thoracic aorta. Normal caliber pulmonary arteries. Normal heart size. No significant pericardial fluid/thickening. Mediastinum/Nodes: No discrete thyroid nodules. Unremarkable esophagus. No pathologically enlarged axillary, mediastinal or hilar lymph nodes. Lungs/Pleura: No pneumothorax. No pleural effusion. Mild centrilobular and paraseptal emphysema with mild diffuse bronchial wall thickening. No acute consolidative airspace disease or  lung masses. Several scattered solid pulmonary nodules in both lungs, largest 7 mm in the left lower lobe (series 6/image 73). Hypoventilatory changes in the dependent lower lobes. Upper abdomen: Exophytic hypodense 2.5 cm renal cortical lesion in the posterior upper left kidney (series 4/image 99) with density 38 HU.  Musculoskeletal: No aggressive appearing focal osseous lesions. Mild thoracic spondylosis. Review of the MIP images confirms the above findings. IMPRESSION: 1. No pulmonary embolism. 2. Exophytic indeterminate 2.5 cm renal cortical lesion in the posterior upper left kidney, cannot exclude renal cell carcinoma. Renal mass protocol MRI (preferred) or CT abdomen without and with IV contrast recommended for further characterization, which may be performed on a short term outpatient basis. 3. Scattered solid pulmonary nodules in both lungs, largest 7 mm. Non-contrast chest CT at 3-6 months is recommended. If the nodules are stable at time of repeat CT, then future CT at 18-24 months (from today's scan) is considered optional for low-risk patients, but is recommended for high-risk patients. This recommendation follows the consensus statement: Guidelines for Management of Incidental Pulmonary Nodules Detected on CT Images: From the Fleischner Society 2017; Radiology 2017; 284:228-243. Aortic Atherosclerosis (ICD10-I70.0) and Emphysema (ICD10-J43.9). Electronically Signed   By: Ilona Sorrel M.D.   On: 09/06/2018 16:58   Dg Chest Port 1 View  Result Date: 09/06/2018 CLINICAL DATA:  Patient with shortness of breath. EXAM: PORTABLE CHEST 1 VIEW COMPARISON:  None. FINDINGS: Normal cardiac and mediastinal contours. No consolidative pulmonary opacities. No pleural effusion or pneumothorax. IMPRESSION: No acute cardiopulmonary process. Electronically Signed   By: Lovey Newcomer M.D.   On: 09/06/2018 14:39    Procedures .Critical Care Performed by: Deliah Boston, PA-C Authorized by: Deliah Boston, PA-C    Critical care provider statement:    Critical care time (minutes):  35   Critical care was necessary to treat or prevent imminent or life-threatening deterioration of the following conditions:  Cardiac failure (Elevated troponin in setting of CP and SOB)   Critical care was time spent personally by me on the following activities:  Discussions with consultants, evaluation of patient's response to treatment, examination of patient, ordering and performing treatments and interventions, ordering and review of laboratory studies, ordering and review of radiographic studies, pulse oximetry, re-evaluation of patient's condition, obtaining history from patient or surrogate, review of old charts and development of treatment plan with patient or surrogate   (including critical care time)  Medications Ordered in ED Medications  LORazepam (ATIVAN) injection 1 mg (1 mg Intravenous Given 09/06/18 1434)  sodium chloride 0.9 % bolus 1,000 mL (0 mLs Intravenous Stopped 09/06/18 1633)  iohexol (OMNIPAQUE) 350 MG/ML injection 100 mL (63 mLs Intravenous Contrast Given 09/06/18 1604)  aspirin chewable tablet 324 mg (324 mg Oral Given 09/06/18 1805)  metoCLOPramide (REGLAN) injection 10 mg (10 mg Intravenous Given 09/06/18 1805)  diphenhydrAMINE (BENADRYL) injection 25 mg (25 mg Intravenous Given 09/06/18 1805)  enoxaparin (LOVENOX) injection 65 mg (65 mg Subcutaneous Given 09/06/18 1821)     Initial Impression / Assessment and Plan / ED Course  I have reviewed the triage vital signs and the nursing notes.  Pertinent labs & imaging results that were available during my care of the patient were reviewed by me and considered in my medical decision making (see chart for details).  Clinical Course as of Sep 06 2047  Fri Sep 06, 2018  1522 Patient reports pain-free after 1 mg Ativan.   [BM]    Clinical Course User Index [BM] Deliah Boston, PA-C   Patient arrives anxious reporting chest pain, shortness of breath and  headache she reports that she feels she does have an anxiety attack today and needs something to calm down.  1 mg Ativan ordered.  Chest pain work-up ordered. - Initial high-sensitivity troponin 36 CBC  nonacute BMP nonacute EKG: Normal sinus rhythm Right atrial enlargement Nonspecific ST and T wave abnormality Abnormal ECG Confirmed by Elnora Morrison Chest x-ray: IMPRESSION: No acute cardiopulmonary process. - Patient reevaluated she is now resting comfortably and in no acute distress reports resolution of her chest pain and shortness of breath following Ativan today, reports that she still has a mild headache but no other complaints at this time.  Discussed case with Dr. Reather Converse, as patient with an increased troponin in the setting of chest pain and shortness of breath we will obtain a CT pulmonary embolism study for rule out.  Additionally will obtain CT head as patient with elevated troponin and a headache.  Low suspicion for dissection based on history and and presentation today. - CT head:  IMPRESSION:  1. No acute intracranial abnormality.  2. Mild cerebral and cerebellar atrophy for age.   CT PE Study:  1. No pulmonary embolism.  2. Exophytic indeterminate 2.5 cm renal cortical lesion in the  posterior upper left kidney, cannot exclude renal cell carcinoma.  Renal mass protocol MRI (preferred) or CT abdomen without and with  IV contrast recommended for further characterization, which may be  performed on a short term outpatient basis.  3. Scattered solid pulmonary nodules in both lungs, largest 7 mm.  Non-contrast chest CT at 3-6 months is recommended. If the nodules  are stable at time of repeat CT, then future CT at 18-24 months  (from today's scan) is considered optional for low-risk patients,  but is recommended for high-risk patients. This recommendation  follows the consensus statement: Guidelines for Management of  Incidental Pulmonary Nodules Detected on CT Images: From  the  Fleischner Society 2017; Radiology 2017; 284:228-243.    Aortic Atherosclerosis (ICD10-I70.0) and Emphysema (ICD10-J43.9).   Delta high-sensitivity troponin 61 - Patient's troponin is now increased by 25 points.  Cardiology was consulted and case was discussed with Dr. Caryl Comes who recommends admission to hospitalist service, does not recommend heparin at this time, recommends Lovenox. - Patient seen and evaluated by Dr. Reather Converse.  Agrees with plan of care and hospitalist admission at this time.    Migraine cocktail given for headache. - Patient updated on plan of care and need for admission she is agreeable. - Case discussed with hospitalist service who have accepted patient for admission. - Patient reassessed, resting comfortably no acute distress.  No change to presentation, remains chest pain-free.   Note: Portions of this report may have been transcribed using voice recognition software. Every effort was made to ensure accuracy; however, inadvertent computerized transcription errors may still be present. Final Clinical Impressions(s) / ED Diagnoses   Final diagnoses:  Chest pain, unspecified type  Shortness of breath  Nonintractable headache, unspecified chronicity pattern, unspecified headache type  Elevated troponin    ED Discharge Orders    None       Gari Crown 09/06/18 2121    Elnora Morrison, MD 09/07/18 [phone]

## 2018-09-07 ENCOUNTER — Inpatient Hospital Stay (HOSPITAL_COMMUNITY): Payer: BLUE CROSS/BLUE SHIELD

## 2018-09-07 ENCOUNTER — Other Ambulatory Visit: Payer: Self-pay

## 2018-09-07 DIAGNOSIS — N2889 Other specified disorders of kidney and ureter: Secondary | ICD-10-CM

## 2018-09-07 DIAGNOSIS — R079 Chest pain, unspecified: Secondary | ICD-10-CM

## 2018-09-07 DIAGNOSIS — I361 Nonrheumatic tricuspid (valve) insufficiency: Secondary | ICD-10-CM

## 2018-09-07 DIAGNOSIS — I219 Acute myocardial infarction, unspecified: Secondary | ICD-10-CM

## 2018-09-07 DIAGNOSIS — R918 Other nonspecific abnormal finding of lung field: Secondary | ICD-10-CM

## 2018-09-07 DIAGNOSIS — D472 Monoclonal gammopathy: Secondary | ICD-10-CM

## 2018-09-07 DIAGNOSIS — F411 Generalized anxiety disorder: Secondary | ICD-10-CM

## 2018-09-07 DIAGNOSIS — R7989 Other specified abnormal findings of blood chemistry: Secondary | ICD-10-CM

## 2018-09-07 HISTORY — DX: Acute myocardial infarction, unspecified: I21.9

## 2018-09-07 LAB — LIPID PANEL
Cholesterol: 172 mg/dL (ref 0–200)
HDL: 64 mg/dL (ref >40)
LDL Cholesterol: 82 mg/dL (ref 0–99)
Total CHOL/HDL Ratio: 2.7 RATIO
Triglycerides: 128 mg/dL (ref <150)
VLDL: 26 mg/dL (ref 0–40)

## 2018-09-07 LAB — ECHOCARDIOGRAM COMPLETE
Height: 64 in
Weight: 2176 oz

## 2018-09-07 LAB — TROPONIN I (HIGH SENSITIVITY)
Troponin I (High Sensitivity): 18 ng/L — ABNORMAL HIGH (ref <18)
Troponin I (High Sensitivity): 27 ng/L — ABNORMAL HIGH (ref <18)

## 2018-09-07 MED ORDER — MORPHINE SULFATE (PF) 2 MG/ML IV SOLN
2.0000 mg | INTRAVENOUS | Status: DC | PRN
  Administered 2018-09-07 – 2018-09-08 (×2): 2 mg via INTRAVENOUS
  Filled 2018-09-07 (×2): qty 1

## 2018-09-07 MED ORDER — ASPIRIN EC 81 MG PO TBEC
81.0000 mg | DELAYED_RELEASE_TABLET | Freq: Every day | ORAL | Status: DC
  Administered 2018-09-07 – 2018-09-08 (×2): 81 mg via ORAL
  Filled 2018-09-07 (×2): qty 1

## 2018-09-07 MED ORDER — AMLODIPINE BESYLATE 2.5 MG PO TABS
2.5000 mg | ORAL_TABLET | Freq: Every day | ORAL | Status: DC
  Administered 2018-09-07 – 2018-09-08 (×2): 2.5 mg via ORAL
  Filled 2018-09-07 (×2): qty 1

## 2018-09-07 MED ORDER — ATORVASTATIN CALCIUM 40 MG PO TABS
40.0000 mg | ORAL_TABLET | Freq: Every day | ORAL | Status: DC
  Administered 2018-09-07 – 2018-09-08 (×2): 40 mg via ORAL
  Filled 2018-09-07 (×2): qty 1

## 2018-09-07 MED ORDER — ENOXAPARIN SODIUM 60 MG/0.6ML ~~LOC~~ SOLN
60.0000 mg | Freq: Two times a day (BID) | SUBCUTANEOUS | Status: DC
  Administered 2018-09-07 (×2): 60 mg via SUBCUTANEOUS
  Filled 2018-09-07 (×3): qty 0.6

## 2018-09-07 MED ORDER — ALPRAZOLAM 0.5 MG PO TABS
0.5000 mg | ORAL_TABLET | Freq: Once | ORAL | Status: AC
  Administered 2018-09-07: 09:00:00 0.5 mg via ORAL
  Filled 2018-09-07: qty 1

## 2018-09-07 MED ORDER — GADOBUTROL 1 MMOL/ML IV SOLN
6.2000 mL | Freq: Once | INTRAVENOUS | Status: AC | PRN
  Administered 2018-09-07: 04:00:00 6.2 mL via INTRAVENOUS

## 2018-09-07 MED ORDER — LORAZEPAM 0.5 MG PO TABS
0.5000 mg | ORAL_TABLET | Freq: Once | ORAL | Status: AC
  Administered 2018-09-07: 0.5 mg via ORAL
  Filled 2018-09-07: qty 1

## 2018-09-07 MED ORDER — HYDROCORTISONE 1 % EX CREA
TOPICAL_CREAM | Freq: Two times a day (BID) | CUTANEOUS | Status: DC | PRN
  Filled 2018-09-07: qty 28

## 2018-09-07 NOTE — Consult Note (Signed)
Reason for Consult:chest pain minimally elevated troponin I Referring Physician:triad hospitalist  Stacy Sweeney is an 63 y.o. female.  KGM:Stacy Sweeney is 63 year old female with past medical history significant for nonobstructive CAD, remote tobacco abuse, strong family history of coronary artery disease was admitted yesterday because of retrosternal chest tightness radiating to back, neck, associated with dizziness while shopping in [employer].  Patient states she had markedly elevated blood pressure and heart rate and was very anxious.  Denies any exertional chest pain and states she walks every day.  4-5 miles without any problems.  Head stress test in March 2019 which was abnormal.  Subsequently had cardiac catheterization which showed nonobstructive CAD.  As per patient's.  EKG done in the ED showed minor nonspecific T-wave changes and was noted to have hypersensitivity.  Troponin high minimally elevated.  Patient denies any shortness of breath.  Denies PND, orthopnea or leg swelling.  States used to smoke one pack per day for 40+ years, quit 9 years ago.  He also used to drink alcohol heavily, quit in 1990s.  Past Medical History:  Diagnosis Date  . Anxiety   . Depression     Past Surgical History:  Procedure Laterality Date  . ANKLE SURGERY    . EYE SURGERY    . TONSILLECTOMY    . TUBAL LIGATION      Family History  Problem Relation Age of Onset  . Osteoporosis Mother   . Rheum arthritis Mother   . Multiple sclerosis Father   . Heart Problems Brother   . Diabetes Brother     Social History:  reports that she has quit smoking. She has never used smokeless tobacco. She reports that she does not drink alcohol or use drugs.  Allergies: No Active Allergies  Medications: I have reviewed the patient's current medications.  Results for orders placed or performed during the hospital encounter of 09/06/18 (from the past 48 hour(s))  Basic metabolic panel     Status: Abnormal    Collection Time: 09/06/18  2:29 PM  Result Value Ref Range   Sodium 143 135 - 145 mmol/L   Potassium 4.1 3.5 - 5.1 mmol/L   Chloride 107 98 - 111 mmol/L   CO2 22 22 - 32 mmol/L   Glucose, Bld 104 (H) 70 - 99 mg/dL   BUN 19 8 - 23 mg/dL   Creatinine, Ser 0.77 0.44 - 1.00 mg/dL   Calcium 9.8 8.9 - 10.3 mg/dL   GFR calc non Af Amer >60 >60 mL/min   GFR calc Af Amer >60 >60 mL/min   Anion gap 14 5 - 15    Comment: Performed at [facility], [address] [place], [place] [address]  Troponin I (High Sensitivity)     Status: Abnormal   Collection Time: 09/06/18  2:29 PM  Result Value Ref Range   Troponin I (High Sensitivity) 36 (H) <18 ng/L    Comment: (NOTE) Elevated high sensitivity troponin I (hsTnI) values and significant  changes across serial measurements may suggest ACS but many other  chronic and acute conditions are known to elevate hsTnI results.  Refer to the "Links" section for chest pain algorithms and additional  guidance. Performed at [facility] [address] [place], [place] [address]   CBC with Differential     Status: Abnormal   Collection Time: 09/06/18  2:29 PM  Result Value Ref Range   WBC 9.3 4.0 - 10.5 K/uL   RBC  4.19 3.87 - 5.11 MIL/uL   Hemoglobin 12.0 12.0 - 15.0 g/dL   HCT 37.0 36.0 - 46.0 %   MCV 88.3 80.0 - 100.0 fL   MCH 28.6 26.0 - 34.0 pg   MCHC 32.4 30.0 - 36.0 g/dL   RDW 12.4 11.5 - 15.5 %   Platelets 377 150 - 400 K/uL   nRBC 0.0 0.0 - 0.2 %   Neutrophils Relative % 56 %   Neutro Abs 5.2 1.7 - 7.7 K/uL   Lymphocytes Relative 30 %   Lymphs Abs 2.8 0.7 - 4.0 K/uL   Monocytes Relative 11 %   Monocytes Absolute 1.1 (H) 0.1 - 1.0 K/uL   Eosinophils Relative 2 %   Eosinophils Absolute 0.2 0.0 - 0.5 K/uL   Basophils Relative 1 %   Basophils Absolute 0.1 0.0 - 0.1 K/uL   Immature Granulocytes 0 %   Abs Immature Granulocytes 0.03 0.00 - 0.07 K/uL    Comment: Performed at [facility], [address]  [place], [place] [address]  Troponin I (High Sensitivity)     Status: Abnormal   Collection Time: 09/06/18  4:03 PM  Result Value Ref Range   Troponin I (High Sensitivity) 61 (H) <18 ng/L    Comment: DELTA CHECK NOTED (NOTE) Elevated high sensitivity troponin I (hsTnI) values and significant  changes across serial measurements may suggest ACS but many other  chronic and acute conditions are known to elevate hsTnI results.  Refer to the Links section for chest pain algorithms and additional  guidance. Performed at [facility], [address] [place], [place] [address]   SARS Coronavirus 2 (Hosp order,Performed in [facility] lab via [employer] ID)     Status: None   Collection Time: 09/06/18  6:12 PM   Specimen: Dry Nasal Swab (Abbott ID Now)  Result Value Ref Range   SARS Coronavirus 2 (Abbott ID Now) NEGATIVE NEGATIVE    Comment: (NOTE) SARS-CoV-2 target nucleic acids are NOT DETECTED. The SARS-CoV-2 RNA is generally detectable in upper and lower respiratory specimens during the acute phase of infection.  Negativeresults do not preclude SARS-CoV-2 infection, do not rule out coinfections with other pathogens, and should not be used as the  sole basis for treatment or other patient management decisions.  Negative results must be combined with clinical observations, patient history, and epidemiological information. The expected result is Negative. Fact Sheet for Patients: GolfingFamily.no Fact Sheet for Healthcare Providers: https://www.hernandez-brewer.com/ This test is not yet approved or cleared by the [place] FDA and  has been authorized for detection and/or diagnosis of SARS-CoV-2 by FDA under an Emergency Use Authorization (EUA).  This EUA will remain in effect (meaning this test can be used) for the duration of  the COVID19 declaration under Section 5 64(b)(1) of the Act, 21 U.S.C.  section [phone] 3(b)(1), unless the authorization  is terminated or revoked sooner. Performed at [facility], [address] [place], [place] [address]   Lipid panel     Status: None   Collection Time: 09/06/18 11:51 PM  Result Value Ref Range   Cholesterol 172 0 - 200 mg/dL   Triglycerides 128 <150 mg/dL   HDL 64 >40 mg/dL   Total CHOL/HDL Ratio 2.7 RATIO   VLDL 26 0 - 40 mg/dL   LDL Cholesterol 82 0 - 99 mg/dL    Comment:        Total Cholesterol/HDL:CHD Risk Coronary Heart Disease Risk Table  Men   Women  1/2 Average Risk   3.4   3.3  Average Risk       5.0   4.4  2 X Average Risk   9.6   7.1  3 X Average Risk  23.4   11.0        Use the calculated Patient Ratio above and the CHD Risk Table to determine the patient's CHD Risk.        ATP III CLASSIFICATION (LDL):  <100     mg/dL   Optimal  100-129  mg/dL   Near or Above                    Optimal  130-159  mg/dL   Borderline  160-189  mg/dL   High  >190     mg/dL   Very High Performed at [facility] [address] [place], [place] [address]   Troponin I (High Sensitivity)     Status: Abnormal   Collection Time: 09/06/18 11:51 PM  Result Value Ref Range   Troponin I (High Sensitivity) 27 (H) <18 ng/L    Comment: (NOTE) Elevated high sensitivity troponin I (hsTnI) values and significant  changes across serial measurements may suggest ACS but many other  chronic and acute conditions are known to elevate hsTnI results.  Refer to the "Links" section for chest pain algorithms and additional  guidance. Performed at [facility], [place] [address] [place], [place] [address]   Troponin I (High Sensitivity)     Status: Abnormal   Collection Time: 09/07/18  5:03 AM  Result Value Ref Range   Troponin I (High Sensitivity) 18 (H) <18 ng/L    Comment: (NOTE) Elevated high sensitivity troponin I (hsTnI) values and significant  changes across serial measurements may suggest ACS but many other  chronic and acute conditions are  known to elevate hsTnI results.  Refer to the "Links" section for chest pain algorithms and additional  guidance. Performed at [facility], [place] [address], [place], [place] [address]     Ct Head Wo Contrast  Result Date: 09/06/2018 CLINICAL DATA:  Severe headache.  Dizziness and nausea.  No trauma. EXAM: CT HEAD WITHOUT CONTRAST TECHNIQUE: Contiguous axial images were obtained from the base of the skull through the vertex without intravenous contrast. COMPARISON:  07/01/2007 FINDINGS: Brain: Mild cerebral atrophy. Cerebellar atrophy is felt to be mildly age advanced. No mass lesion, hemorrhage, hydrocephalus, acute infarct, intra-axial, or extra-axial fluid collection. Vascular: No hyperdense vessel or unexpected calcification. Skull: Normal.  Minimal motion degradation inferiorly. Sinuses/Orbits: Normal imaged portions of the orbits and globes. Clear paranasal sinuses and mastoid air cells. Other: None. IMPRESSION: 1.  No acute intracranial abnormality. 2. Mild cerebral and cerebellar atrophy for age. Electronically Signed   By: Abigail Miyamoto M.D.   On: 09/06/2018 16:43   Ct Angio Chest Pe W And/or Wo Contrast  Result Date: 09/06/2018 CLINICAL DATA:  Chest and upper back pain.  Dyspnea. EXAM: CT ANGIOGRAPHY CHEST WITH CONTRAST TECHNIQUE: Multidetector CT imaging of the chest was performed using the standard protocol during bolus administration of intravenous contrast. Multiplanar CT image reconstructions and MIPs were obtained to evaluate the vascular anatomy. CONTRAST:  57mL OMNIPAQUE IOHEXOL 350 MG/ML SOLN COMPARISON:  Chest radiograph from earlier today. FINDINGS: Cardiovascular: The study is high quality for the evaluation of pulmonary embolism. There are no filling defects in the central, lobar, segmental or subsegmental pulmonary artery branches to suggest  acute pulmonary embolism. Mildly atherosclerotic nonaneurysmal thoracic aorta. Normal caliber pulmonary arteries. Normal heart size. No  significant pericardial fluid/thickening. Mediastinum/Nodes: No discrete thyroid nodules. Unremarkable esophagus. No pathologically enlarged axillary, mediastinal or hilar lymph nodes. Lungs/Pleura: No pneumothorax. No pleural effusion. Mild centrilobular and paraseptal emphysema with mild diffuse bronchial wall thickening. No acute consolidative airspace disease or lung masses. Several scattered solid pulmonary nodules in both lungs, largest 7 mm in the left lower lobe (series 6/image 73). Hypoventilatory changes in the dependent lower lobes. Upper abdomen: Exophytic hypodense 2.5 cm renal cortical lesion in the posterior upper left kidney (series 4/image 99) with density 38 HU. Musculoskeletal: No aggressive appearing focal osseous lesions. Mild thoracic spondylosis. Review of the MIP images confirms the above findings. IMPRESSION: 1. No pulmonary embolism. 2. Exophytic indeterminate 2.5 cm renal cortical lesion in the posterior upper left kidney, cannot exclude renal cell carcinoma. Renal mass protocol MRI (preferred) or CT abdomen without and with IV contrast recommended for further characterization, which may be performed on a short term outpatient basis. 3. Scattered solid pulmonary nodules in both lungs, largest 7 mm. Non-contrast chest CT at 3-6 months is recommended. If the nodules are stable at time of repeat CT, then future CT at 18-24 months (from today's scan) is considered optional for low-risk patients, but is recommended for high-risk patients. This recommendation follows the consensus statement: Guidelines for Management of Incidental Pulmonary Nodules Detected on CT Images: From the Fleischner Society 2017; Radiology 2017; 284:228-243. Aortic Atherosclerosis (ICD10-I70.0) and Emphysema (ICD10-J43.9). Electronically Signed   By: Ilona Sorrel M.D.   On: 09/06/2018 16:58   Mr Abdomen W Wo Contrast  Result Date: 09/07/2018 CLINICAL DATA:  Recent admission for chest pain. CT chest demonstrating a  left renal mass. EXAM: MRI ABDOMEN WITHOUT AND WITH CONTRAST TECHNIQUE: Multiplanar multisequence MR imaging of the abdomen was performed both before and after the administration of intravenous contrast. CONTRAST:  6 cc of Gadavist COMPARISON:  CT chest of 1 day prior FINDINGS: Portions of exam are minimally motion degraded. Lower chest: Normal heart size without pericardial or pleural effusion. Hepatobiliary: Normal liver. Normal gallbladder, without biliary ductal dilatation. Pancreas:  Normal, without mass or ductal dilatation. Spleen:  Normal in size, without focal abnormality. Adrenals/Urinary Tract: Normal adrenal glands. Normal right kidney. Posterior exophytic interpolar left renal 2.3 cm lesion demonstrates T2 hyperintensity and mild precontrast T1 hyperintensity. No post-contrast enhancement, including on subtracted images. No hydronephrosis. Stomach/Bowel: Normal stomach and abdominal bowel loops. Vascular/Lymphatic: Aortic atherosclerosis. No abdominal adenopathy. Other:  No ascites. Musculoskeletal: No acute osseous abnormality. IMPRESSION: 1. The left renal lesion represents a hemorrhagic/proteinaceous cyst. 2.  Aortic Atherosclerosis (ICD10-I70.0). 3.  No acute abdominal process. Electronically Signed   By: Abigail Miyamoto M.D.   On: 09/07/2018 10:43   Dg Chest Port 1 View  Result Date: 09/06/2018 CLINICAL DATA:  Patient with shortness of breath. EXAM: PORTABLE CHEST 1 VIEW COMPARISON:  None. FINDINGS: Normal cardiac and mediastinal contours. No consolidative pulmonary opacities. No pleural effusion or pneumothorax. IMPRESSION: No acute cardiopulmonary process. Electronically Signed   By: Lovey Newcomer M.D.   On: 09/06/2018 14:39    Review of Systems  Constitutional: Negative for chills, fever and malaise/fatigue.  HENT: Negative for hearing loss.   Eyes: Negative for blurred vision.  Respiratory: Negative for cough and shortness of breath.   Cardiovascular: Positive for chest pain. Negative for  orthopnea and leg swelling.  Gastrointestinal: Negative for abdominal pain, nausea and vomiting.  Genitourinary: Negative for dysuria.  Neurological: Positive for dizziness.   Blood pressure 139/65, pulse 69, temperature 97.9 F (36.6 C), temperature source Oral, resp. rate 19, height 5\' 4"  (1.626 m), weight 61.7 kg, SpO2 100 %. Physical Exam  Constitutional: She is oriented to person, place, and time.  HENT:  Head: Normocephalic and atraumatic.  Eyes: Pupils are equal, round, and reactive to light. Conjunctivae are normal. Left eye exhibits no discharge. No scleral icterus.  Neck: Normal range of motion. Neck supple. No JVD present. No tracheal deviation present. No thyromegaly present.  Cardiovascular: Normal rate and regular rhythm.  Murmur (soft systolic and diastolic murmur noted) heard. Respiratory: Effort normal and breath sounds normal. No respiratory distress. She has no wheezes. She has no rales.  GI: Soft. Bowel sounds are normal. She exhibits no distension. There is no abdominal tenderness. There is no rebound.  Musculoskeletal:        General: No tenderness, deformity or edema.  Neurological: She is alert and oriented to person, place, and time.    Assessment/Plan: Atypical chest pain with minimally elevated high sensitivity troponin are probably due to demand ischemia doubt significant MI Hypertension Remote tobacco abuse. Remote EtOH abuse. Anxiety disorder. Elevated blood sugar. Plan Start aspirin 81 mg one tablet daily Start amlodipine2.5 mg daily Start atorvastatin 40 mg daily.  Will schedule for lexiscan myoview in a.m. Charolette Forward 09/07/2018, 11:52 AM

## 2018-09-07 NOTE — Progress Notes (Signed)
ANTICOAGULATION CONSULT NOTE - Initial Consult  Pharmacy Consult for Lovenox  Indication: chest pain/ACS  No Active Allergies  Patient Measurements: Height: 5\' 4"  (162.6 cm) Weight: 137 lb (62.1 kg) IBW/kg (Calculated) : 54.7  Vital Signs: Temp: 98.5 F (36.9 C) (07/03 2152) Temp Source: Oral (07/03 2152) BP: 123/52 (07/03 2152) Pulse Rate: 66 (07/03 2152)  Labs: Recent Labs    09/06/18 1429 09/06/18 1603  HGB 12.0  --   HCT 37.0  --   PLT 377  --   CREATININE 0.77  --   TROPONINIHS 36* 61*    Estimated Creatinine Clearance: 62.2 mL/min (by C-G formula based on SCr of 0.77 mg/dL).   Medical History: Past Medical History:  Diagnosis Date  . Anxiety   . Depression     Assessment: 63 y/o F with chest pain, high sensitivity troponin is mildly elevated, CBC good, PTA meds reviewed, starting Lovenox  Goal of Therapy:  Monitor platelets by anticoagulation protocol: Yes   Plan:  Lovenox 60 mg subcutaneous q12h Daily CBC Monitor for bleeding  Narda Bonds, PharmD, BCPS Clinical Pharmacist Phone: [phone]

## 2018-09-07 NOTE — Progress Notes (Signed)
PROGRESS NOTE    Stacy Sweeney  ZES:[id number] DOB: 07/02/1955 DOA: 09/06/2018 PCP: System, Pcp Not In    Brief Narrative:  63 year old female admitted to the hospital with atypical chest pain.  She had mild elevation of troponins.  EKG did not show any acute findings.  Admitted for further cardiac work-up.   Assessment & Plan:   Principal Problem:   NSTEMI (non-ST elevated myocardial infarction) ([place]) Active Problems:   GAD (generalized anxiety disorder)   IgM monoclonal gammopathy of uncertain significance   Renal mass   Pulmonary nodules   1. Atypical chest pain with minimally elevated troponin.  Possibly related to demand ischemia.  Seen by cardiology and plan for stress test in a.m.  Continue on aspirin, amlodipine and Lipitor. 2. Possible renal mass.  Further evaluated by MRI and shown to be hemorrhagic cyst. 3. Pulmonary nodules.  Noted on CT chest.  Will need repeat CT in 3 to 6 months. 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 5. Anxiety.  Likely contributing to above symptoms.  Improved today after receiving a dose of Xanax.   DVT prophylaxis: Lovenox Code Status: Full code Family Communication: Discussed with patient Disposition Plan: Discharge home once cardiology work-up complete   Consultants:   Cardiology, Dr. Terrence Dupont  Procedures:  Echo: 1. The left ventricle has normal systolic function with an ejection fraction of 60-65%. The cavity size was normal. Left ventricular diastolic Doppler parameters are consistent with impaired relaxation. No evidence of left ventricular regional wall  motion abnormalities.  2. The right ventricle has normal systolic function. The cavity was normal. There is no increase in right ventricular wall thickness. Right ventricular systolic pressure is mildly elevated with an estimated pressure of 39.1 mmHg.  3. The aortic valve is tricuspid. Aortic valve regurgitation is mild by color flow Doppler.   4. The interatrial septum was  not assessed.  Antimicrobials:      Subjective: She was having chest discomfort this morning.  She has a significant headache.  Objective: Vitals:   09/07/18 0844 09/07/18 1255 09/07/18 1700 09/07/18 1847  BP: 139/65 (!) 130/57 (!) 134/94 (!) 157/89  Pulse:  71 85 71  Resp:   (!) 28   Temp:  98.4 F (36.9 C) 99.2 F (37.3 C) 98.8 F (37.1 C)  TempSrc:  Oral Axillary Oral  SpO2:  100% 100% 100%  Weight:      Height:       No intake or output data in the 24 hours ending 09/07/18 1953 Filed Weights   09/06/18 1346 09/06/18 2152 09/07/18 0407  Weight: 65.3 kg 62.1 kg 61.7 kg    Examination:  General exam: Appears calm and comfortable  Respiratory system: Clear to auscultation. Respiratory effort normal. Cardiovascular system: S1 & S2 heard, RRR. No JVD, murmurs, rubs, gallops or clicks. No pedal edema. Gastrointestinal system: Abdomen is nondistended, soft and nontender. No organomegaly or masses felt. Normal bowel sounds heard. Central nervous system: Alert and oriented. No focal neurological deficits. Extremities: Symmetric 5 x 5 power. Skin: No rashes, lesions or ulcers Psychiatry: Judgement and insight appear normal. Mood & affect appropriate.     Data Reviewed: I have personally reviewed following labs and imaging studies  CBC: Recent Labs  Lab 09/06/18 1429  WBC 9.3  NEUTROABS 5.2  HGB 12.0  HCT 37.0  MCV 88.3  PLT 263   Basic Metabolic Panel: Recent Labs  Lab 09/06/18 1429  NA 143  K 4.1  CL 107  CO2  22  GLUCOSE 104*  BUN 19  CREATININE 0.77  CALCIUM 9.8   GFR: Estimated Creatinine Clearance: 62.2 mL/min (by C-G formula based on SCr of 0.77 mg/dL). Liver Function Tests: No results for input(s): AST, ALT, ALKPHOS, BILITOT, PROT, ALBUMIN in the last 168 hours. No results for input(s): LIPASE, AMYLASE in the last 168 hours. No results for input(s): AMMONIA in the last 168 hours. Coagulation Profile: No results for input(s): INR, PROTIME in  the last 168 hours. Cardiac Enzymes: No results for input(s): CKTOTAL, CKMB, CKMBINDEX, TROPONINI in the last 168 hours. BNP (last 3 results) No results for input(s): PROBNP in the last 8760 hours. HbA1C: No results for input(s): HGBA1C in the last 72 hours. CBG: No results for input(s): GLUCAP in the last 168 hours. Lipid Profile: Recent Labs    09/06/18 2351  CHOL 172  HDL 64  LDLCALC 82  TRIG 128  CHOLHDL 2.7   Thyroid Function Tests: No results for input(s): TSH, T4TOTAL, FREET4, T3FREE, THYROIDAB in the last 72 hours. Anemia Panel: No results for input(s): VITAMINB12, FOLATE, FERRITIN, TIBC, IRON, RETICCTPCT in the last 72 hours. Sepsis Labs: No results for input(s): PROCALCITON, LATICACIDVEN in the last 168 hours.  Recent Results (from the past 240 hour(s))  SARS Coronavirus 2 (Hosp order,Performed in [facility] lab via [employer] ID)     Status: None   Collection Time: 09/06/18  6:12 PM   Specimen: Dry Nasal Swab ([employer] Now)  Result Value Ref Range Status   SARS Coronavirus 2 ([employer] Now) NEGATIVE NEGATIVE Final    Comment: (NOTE) SARS-CoV-2 target nucleic acids are NOT DETECTED. The SARS-CoV-2 RNA is generally detectable in upper and lower respiratory specimens during the acute phase of infection.  Negativeresults do not preclude SARS-CoV-2 infection, do not rule out coinfections with other pathogens, and should not be used as the  sole basis for treatment or other patient management decisions.  Negative results must be combined with clinical observations, patient history, and epidemiological information. The expected result is Negative. Fact Sheet for Patients: GolfingFamily.no Fact Sheet for Healthcare Providers: https://www.hernandez-brewer.com/ This test is not yet approved or cleared by the [place] FDA and  has been authorized for detection and/or diagnosis of SARS-CoV-2 by FDA under an Emergency Use Authorization  (EUA).  This EUA will remain in effect (meaning this test can be used) for the duration of  the COVID19 declaration under Section 5 64(b)(1) of the Act, 21 U.S.C.  section [phone] 3(b)(1), unless the authorization is terminated or revoked sooner. Performed at [facility], [address], [place], [place] [address]          Radiology Studies: Ct Head Wo Contrast  Result Date: 09/06/2018 CLINICAL DATA:  Severe headache.  Dizziness and nausea.  No trauma. EXAM: CT HEAD WITHOUT CONTRAST TECHNIQUE: Contiguous axial images were obtained from the base of the skull through the vertex without intravenous contrast. COMPARISON:  07/01/2007 FINDINGS: Brain: Mild cerebral atrophy. Cerebellar atrophy is felt to be mildly age advanced. No mass lesion, hemorrhage, hydrocephalus, acute infarct, intra-axial, or extra-axial fluid collection. Vascular: No hyperdense vessel or unexpected calcification. Skull: Normal.  Minimal motion degradation inferiorly. Sinuses/Orbits: Normal imaged portions of the orbits and globes. Clear paranasal sinuses and mastoid air cells. Other: None. IMPRESSION: 1.  No acute intracranial abnormality. 2. Mild cerebral and cerebellar atrophy for age. Electronically Signed   By: Abigail Miyamoto M.D.   On: 09/06/2018 16:43   Ct Angio Chest Pe W  And/or Wo Contrast  Result Date: 09/06/2018 CLINICAL DATA:  Chest and upper back pain.  Dyspnea. EXAM: CT ANGIOGRAPHY CHEST WITH CONTRAST TECHNIQUE: Multidetector CT imaging of the chest was performed using the standard protocol during bolus administration of intravenous contrast. Multiplanar CT image reconstructions and MIPs were obtained to evaluate the vascular anatomy. CONTRAST:  50mL OMNIPAQUE IOHEXOL 350 MG/ML SOLN COMPARISON:  Chest radiograph from earlier today. FINDINGS: Cardiovascular: The study is high quality for the evaluation of pulmonary embolism. There are no filling defects in the central, lobar, segmental or subsegmental  pulmonary artery branches to suggest acute pulmonary embolism. Mildly atherosclerotic nonaneurysmal thoracic aorta. Normal caliber pulmonary arteries. Normal heart size. No significant pericardial fluid/thickening. Mediastinum/Nodes: No discrete thyroid nodules. Unremarkable esophagus. No pathologically enlarged axillary, mediastinal or hilar lymph nodes. Lungs/Pleura: No pneumothorax. No pleural effusion. Mild centrilobular and paraseptal emphysema with mild diffuse bronchial wall thickening. No acute consolidative airspace disease or lung masses. Several scattered solid pulmonary nodules in both lungs, largest 7 mm in the left lower lobe (series 6/image 73). Hypoventilatory changes in the dependent lower lobes. Upper abdomen: Exophytic hypodense 2.5 cm renal cortical lesion in the posterior upper left kidney (series 4/image 99) with density 38 HU. Musculoskeletal: No aggressive appearing focal osseous lesions. Mild thoracic spondylosis. Review of the MIP images confirms the above findings. IMPRESSION: 1. No pulmonary embolism. 2. Exophytic indeterminate 2.5 cm renal cortical lesion in the posterior upper left kidney, cannot exclude renal cell carcinoma. Renal mass protocol MRI (preferred) or CT abdomen without and with IV contrast recommended for further characterization, which may be performed on a short term outpatient basis. 3. Scattered solid pulmonary nodules in both lungs, largest 7 mm. Non-contrast chest CT at 3-6 months is recommended. If the nodules are stable at time of repeat CT, then future CT at 18-24 months (from today's scan) is considered optional for low-risk patients, but is recommended for high-risk patients. This recommendation follows the consensus statement: Guidelines for Management of Incidental Pulmonary Nodules Detected on CT Images: From the Fleischner Society 2017; Radiology 2017; 284:228-243. Aortic Atherosclerosis (ICD10-I70.0) and Emphysema (ICD10-J43.9). Electronically Signed   By:  Ilona Sorrel M.D.   On: 09/06/2018 16:58   Mr Abdomen W Wo Contrast  Result Date: 09/07/2018 CLINICAL DATA:  Recent admission for chest pain. CT chest demonstrating a left renal mass. EXAM: MRI ABDOMEN WITHOUT AND WITH CONTRAST TECHNIQUE: Multiplanar multisequence MR imaging of the abdomen was performed both before and after the administration of intravenous contrast. CONTRAST:  6 cc of Gadavist COMPARISON:  CT chest of 1 day prior FINDINGS: Portions of exam are minimally motion degraded. Lower chest: Normal heart size without pericardial or pleural effusion. Hepatobiliary: Normal liver. Normal gallbladder, without biliary ductal dilatation. Pancreas:  Normal, without mass or ductal dilatation. Spleen:  Normal in size, without focal abnormality. Adrenals/Urinary Tract: Normal adrenal glands. Normal right kidney. Posterior exophytic interpolar left renal 2.3 cm lesion demonstrates T2 hyperintensity and mild precontrast T1 hyperintensity. No post-contrast enhancement, including on subtracted images. No hydronephrosis. Stomach/Bowel: Normal stomach and abdominal bowel loops. Vascular/Lymphatic: Aortic atherosclerosis. No abdominal adenopathy. Other:  No ascites. Musculoskeletal: No acute osseous abnormality. IMPRESSION: 1. The left renal lesion represents a hemorrhagic/proteinaceous cyst. 2.  Aortic Atherosclerosis (ICD10-I70.0). 3.  No acute abdominal process. Electronically Signed   By: Abigail Miyamoto M.D.   On: 09/07/2018 10:43   Dg Chest Port 1 View  Result Date: 09/06/2018 CLINICAL DATA:  Patient with shortness of breath. EXAM: PORTABLE CHEST 1 VIEW COMPARISON:  None. FINDINGS: Normal cardiac and mediastinal contours. No consolidative pulmonary opacities. No pleural effusion or pneumothorax. IMPRESSION: No acute cardiopulmonary process. Electronically Signed   By: Lovey Newcomer M.D.   On: 09/06/2018 14:39        Scheduled Meds:  amLODipine  2.5 mg Oral Daily   aspirin EC  81 mg Oral Daily    atorvastatin  40 mg Oral q1800   enoxaparin (LOVENOX) injection  60 mg Subcutaneous Q12H   Continuous Infusions:   LOS: 1 day    Time spent: 84mins    Kathie Dike, MD [facility]   If 7PM-7AM, please contact night-coverage [email or web address]  09/07/2018, 7:53 PM

## 2018-09-07 NOTE — Progress Notes (Signed)
Patient called out complaining of 10/10 squeezing chest pain all across chest. Patient also complaining of a tightness across her shoulder blades, numbness in her hands, and a 10/10 headache. BP 184/59. SL nitroglycerin x1 given and EKG obtained. After SL nito administered, BP 139/65 and patient reporting chest pain has improved to 5/10 and numbness in hands has resolved. Headache and shoulder blade pain unchanged. No further nitro given due to patient's severe headache. Text paged Dr. Roderic Palau with the above information.

## 2018-09-07 NOTE — Progress Notes (Signed)
  Echocardiogram 2D Echocardiogram has been performed.  Matilde Bash 09/07/2018, 10:17 AM

## 2018-09-08 ENCOUNTER — Inpatient Hospital Stay (HOSPITAL_COMMUNITY): Payer: BLUE CROSS/BLUE SHIELD

## 2018-09-08 MED ORDER — NITROGLYCERIN 0.4 MG SL SUBL: 0.4000 mg | SUBLINGUAL_TABLET | SUBLINGUAL | 12 refills | Status: AC | PRN

## 2018-09-08 MED ORDER — METHOCARBAMOL 1000 MG/10ML IJ SOLN
500.0000 mg | Freq: Once | INTRAVENOUS | Status: AC
  Administered 2018-09-08: 500 mg via INTRAVENOUS
  Filled 2018-09-08: qty 5

## 2018-09-08 MED ORDER — TRAMADOL HCL 50 MG PO TABS
50.0000 mg | ORAL_TABLET | Freq: Four times a day (QID) | ORAL | Status: DC | PRN
  Administered 2018-09-08 (×2): 50 mg via ORAL
  Filled 2018-09-08 (×2): qty 1

## 2018-09-08 MED ORDER — ATORVASTATIN CALCIUM 40 MG PO TABS: 40.0000 mg | ORAL_TABLET | Freq: Every day | ORAL | 1 refills | Status: AC

## 2018-09-08 MED ORDER — METHOCARBAMOL 500 MG PO TABS
500.0000 mg | ORAL_TABLET | Freq: Three times a day (TID) | ORAL | Status: DC | PRN
  Administered 2018-09-08: 500 mg via ORAL
  Filled 2018-09-08: qty 1

## 2018-09-08 MED ORDER — AMLODIPINE BESYLATE 5 MG PO TABS: 5.0000 mg | ORAL_TABLET | Freq: Every day | ORAL | 1 refills | Status: AC

## 2018-09-08 MED ORDER — ISOSORBIDE MONONITRATE ER 30 MG PO TB24: 15.0000 mg | ORAL_TABLET | Freq: Every day | ORAL | 1 refills | Status: AC

## 2018-09-08 MED ORDER — ISOSORBIDE MONONITRATE ER 30 MG PO TB24
15.0000 mg | ORAL_TABLET | Freq: Every day | ORAL | Status: DC
  Administered 2018-09-08: 15 mg via ORAL
  Filled 2018-09-08: qty 1

## 2018-09-08 MED ORDER — REGADENOSON 0.4 MG/5ML IV SOLN
0.4000 mg | Freq: Once | INTRAVENOUS | Status: AC
  Administered 2018-09-08: 09:00:00 0.4 mg via INTRAVENOUS
  Filled 2018-09-08: qty 5

## 2018-09-08 MED ORDER — ASPIRIN 81 MG PO TBEC: 81.0000 mg | DELAYED_RELEASE_TABLET | Freq: Every day | ORAL | 0 refills | Status: AC

## 2018-09-08 MED ORDER — TECHNETIUM TC 99M TETROFOSMIN IV KIT
30.0000 | PACK | Freq: Once | INTRAVENOUS | Status: AC | PRN
  Administered 2018-09-08: 30 via INTRAVENOUS

## 2018-09-08 MED ORDER — REGADENOSON 0.4 MG/5ML IV SOLN
INTRAVENOUS | Status: AC
  Filled 2018-09-08: qty 5

## 2018-09-08 MED ORDER — TECHNETIUM TC 99M TETROFOSMIN IV KIT
10.0000 | PACK | Freq: Once | INTRAVENOUS | Status: AC | PRN
  Administered 2018-09-08: 10 via INTRAVENOUS

## 2018-09-08 NOTE — Progress Notes (Signed)
Patient c/o of being anxious, having tightness between her shoulder blades and muscle spasms to her upper back. On call Bodenheimer C, NP made aware. Order received for Ativan 0.5 mg and Robaxin 500 mg IV x 1. Ativan was not effective. PRN Morphine given was not effective. However she states Robaxin was helpful.

## 2018-09-08 NOTE — Progress Notes (Signed)
Patient in no distress after lexiscan portion of test.

## 2018-09-08 NOTE — Progress Notes (Signed)
Subjective:  Patient seen in nuclear medicine department denies any chest pain or shortness of breath tolerated stress portion of the Lexiscan stress test, Myoview results are pending  Objective:  Vital Signs in the last 24 hours: Temp:  [97.6 F (36.4 C)-99.2 F (37.3 C)] 98.5 F (36.9 C) (07/05 0529) Pulse Rate:  [61-85] 63 (07/05 0529) Resp:  [16-28] 18 (07/05 0529) BP: (130-172)/(51-94) 159/61 (07/05 0918) SpO2:  [96 %-100 %] 98 % (07/05 0529) Weight:  [60.4 kg] 60.4 kg (07/05 0527)  Intake/Output from previous day: 07/04 0701 - 07/05 0700 In: 290 [P.O.:240; IV Piggyback:50] Out: -  Intake/Output from this shift: No intake/output data recorded.  Physical Exam: Exam unchanged  Lab Results: Recent Labs    09/06/18 1429  WBC 9.3  HGB 12.0  PLT 377   Recent Labs    09/06/18 1429  NA 143  K 4.1  CL 107  CO2 22  GLUCOSE 104*  BUN 19  CREATININE 0.77   No results for input(s): TROPONINI in the last 72 hours.  Invalid input(s): CK, MB Hepatic Function Panel No results for input(s): PROT, ALBUMIN, AST, ALT, ALKPHOS, BILITOT, BILIDIR, IBILI in the last 72 hours. Recent Labs    09/06/18 2351  CHOL 172   No results for input(s): PROTIME in the last 72 hours.  Imaging: Imaging results have been reviewed and Ct Head Wo Contrast  Result Date: 09/06/2018 CLINICAL DATA:  Severe headache.  Dizziness and nausea.  No trauma. EXAM: CT HEAD WITHOUT CONTRAST TECHNIQUE: Contiguous axial images were obtained from the base of the skull through the vertex without intravenous contrast. COMPARISON:  07/01/2007 FINDINGS: Brain: Mild cerebral atrophy. Cerebellar atrophy is felt to be mildly age advanced. No mass lesion, hemorrhage, hydrocephalus, acute infarct, intra-axial, or extra-axial fluid collection. Vascular: No hyperdense vessel or unexpected calcification. Skull: Normal.  Minimal motion degradation inferiorly. Sinuses/Orbits: Normal imaged portions of the orbits and globes.  Clear paranasal sinuses and mastoid air cells. Other: None. IMPRESSION: 1.  No acute intracranial abnormality. 2. Mild cerebral and cerebellar atrophy for age. Electronically Signed   By: Abigail Miyamoto M.D.   On: 09/06/2018 16:43   Ct Angio Chest Pe W And/or Wo Contrast  Result Date: 09/06/2018 CLINICAL DATA:  Chest and upper back pain.  Dyspnea. EXAM: CT ANGIOGRAPHY CHEST WITH CONTRAST TECHNIQUE: Multidetector CT imaging of the chest was performed using the standard protocol during bolus administration of intravenous contrast. Multiplanar CT image reconstructions and MIPs were obtained to evaluate the vascular anatomy. CONTRAST:  30mL OMNIPAQUE IOHEXOL 350 MG/ML SOLN COMPARISON:  Chest radiograph from earlier today. FINDINGS: Cardiovascular: The study is high quality for the evaluation of pulmonary embolism. There are no filling defects in the central, lobar, segmental or subsegmental pulmonary artery branches to suggest acute pulmonary embolism. Mildly atherosclerotic nonaneurysmal thoracic aorta. Normal caliber pulmonary arteries. Normal heart size. No significant pericardial fluid/thickening. Mediastinum/Nodes: No discrete thyroid nodules. Unremarkable esophagus. No pathologically enlarged axillary, mediastinal or hilar lymph nodes. Lungs/Pleura: No pneumothorax. No pleural effusion. Mild centrilobular and paraseptal emphysema with mild diffuse bronchial wall thickening. No acute consolidative airspace disease or lung masses. Several scattered solid pulmonary nodules in both lungs, largest 7 mm in the left lower lobe (series 6/image 73). Hypoventilatory changes in the dependent lower lobes. Upper abdomen: Exophytic hypodense 2.5 cm renal cortical lesion in the posterior upper left kidney (series 4/image 99) with density 38 HU. Musculoskeletal: No aggressive appearing focal osseous lesions. Mild thoracic spondylosis. Review of the MIP images confirms  the above findings. IMPRESSION: 1. No pulmonary embolism. 2.  Exophytic indeterminate 2.5 cm renal cortical lesion in the posterior upper left kidney, cannot exclude renal cell carcinoma. Renal mass protocol MRI (preferred) or CT abdomen without and with IV contrast recommended for further characterization, which may be performed on a short term outpatient basis. 3. Scattered solid pulmonary nodules in both lungs, largest 7 mm. Non-contrast chest CT at 3-6 months is recommended. If the nodules are stable at time of repeat CT, then future CT at 18-24 months (from today's scan) is considered optional for low-risk patients, but is recommended for high-risk patients. This recommendation follows the consensus statement: Guidelines for Management of Incidental Pulmonary Nodules Detected on CT Images: From the Fleischner Society 2017; Radiology 2017; 284:228-243. Aortic Atherosclerosis (ICD10-I70.0) and Emphysema (ICD10-J43.9). Electronically Signed   By: Ilona Sorrel M.D.   On: 09/06/2018 16:58   Mr Abdomen W Wo Contrast  Result Date: 09/07/2018 CLINICAL DATA:  Recent admission for chest pain. CT chest demonstrating a left renal mass. EXAM: MRI ABDOMEN WITHOUT AND WITH CONTRAST TECHNIQUE: Multiplanar multisequence MR imaging of the abdomen was performed both before and after the administration of intravenous contrast. CONTRAST:  6 cc of Gadavist COMPARISON:  CT chest of 1 day prior FINDINGS: Portions of exam are minimally motion degraded. Lower chest: Normal heart size without pericardial or pleural effusion. Hepatobiliary: Normal liver. Normal gallbladder, without biliary ductal dilatation. Pancreas:  Normal, without mass or ductal dilatation. Spleen:  Normal in size, without focal abnormality. Adrenals/Urinary Tract: Normal adrenal glands. Normal right kidney. Posterior exophytic interpolar left renal 2.3 cm lesion demonstrates T2 hyperintensity and mild precontrast T1 hyperintensity. No post-contrast enhancement, including on subtracted images. No hydronephrosis.  Stomach/Bowel: Normal stomach and abdominal bowel loops. Vascular/Lymphatic: Aortic atherosclerosis. No abdominal adenopathy. Other:  No ascites. Musculoskeletal: No acute osseous abnormality. IMPRESSION: 1. The left renal lesion represents a hemorrhagic/proteinaceous cyst. 2.  Aortic Atherosclerosis (ICD10-I70.0). 3.  No acute abdominal process. Electronically Signed   By: Abigail Miyamoto M.D.   On: 09/07/2018 10:43   Dg Chest Port 1 View  Result Date: 09/06/2018 CLINICAL DATA:  Patient with shortness of breath. EXAM: PORTABLE CHEST 1 VIEW COMPARISON:  None. FINDINGS: Normal cardiac and mediastinal contours. No consolidative pulmonary opacities. No pleural effusion or pneumothorax. IMPRESSION: No acute cardiopulmonary process. Electronically Signed   By: Lovey Newcomer M.D.   On: 09/06/2018 14:39    Cardiac Studies:  Assessment/Plan:  Status post atypical chest pain with minimally elevated high sensitivity troponin are probably due to demand ischemia doubt significant MI Hypertension Remote tobacco abuse. Remote EtOH abuse. Anxiety disorder. Elevated blood sugar. Plan Continue present management Okay to discharge home if no evidence of ischemia on nuclear Myoview result.    LOS: 2 days    Charolette Forward 09/08/2018, 9:47 AM

## 2018-09-08 NOTE — Progress Notes (Signed)
Subjective:  Patient denies any chest pain or shortness of breath discussed with patient at length regarding nuclear stress test which showed possible scarring with possible minimal reversible ischemia with normal EF.  2D echo showed no segmental wall motion abnormalities.  Patient states she is very active and exercises regularly without any problems and has not been on any cardiac medications.  Patient also had prior caths which showed nonobstructive CAD.  Objective:  Vital Signs in the last 24 hours: Temp:  [97.6 F (36.4 C)-98.5 F (36.9 C)] 97.7 F (36.5 C) (07/05 1639) Pulse Rate:  [61-66] 64 (07/05 1639) Resp:  [16-18] 18 (07/05 0529) BP: (131-172)/(51-74) 136/74 (07/05 1639) SpO2:  [96 %-99 %] 99 % (07/05 1639) Weight:  [60.4 kg] 60.4 kg (07/05 0527)  Intake/Output from previous day: 07/04 0701 - 07/05 0700 In: 290 [P.O.:240; IV Piggyback:50] Out: -  Intake/Output from this shift: No intake/output data recorded.  Physical Exam: Exam unchanged  Lab Results: Recent Labs    09/06/18 1429  WBC 9.3  HGB 12.0  PLT 377   Recent Labs    09/06/18 1429  NA 143  K 4.1  CL 107  CO2 22  GLUCOSE 104*  BUN 19  CREATININE 0.77   No results for input(s): TROPONINI in the last 72 hours.  Invalid input(s): CK, MB Hepatic Function Panel No results for input(s): PROT, ALBUMIN, AST, ALT, ALKPHOS, BILITOT, BILIDIR, IBILI in the last 72 hours. Recent Labs    09/06/18 2351  CHOL 172   No results for input(s): PROTIME in the last 72 hours.  Imaging: Imaging results have been reviewed and Mr Abdomen W Wo Contrast  Result Date: 09/07/2018 CLINICAL DATA:  Recent admission for chest pain. CT chest demonstrating a left renal mass. EXAM: MRI ABDOMEN WITHOUT AND WITH CONTRAST TECHNIQUE: Multiplanar multisequence MR imaging of the abdomen was performed both before and after the administration of intravenous contrast. CONTRAST:  6 cc of Gadavist COMPARISON:  CT chest of 1 day prior  FINDINGS: Portions of exam are minimally motion degraded. Lower chest: Normal heart size without pericardial or pleural effusion. Hepatobiliary: Normal liver. Normal gallbladder, without biliary ductal dilatation. Pancreas:  Normal, without mass or ductal dilatation. Spleen:  Normal in size, without focal abnormality. Adrenals/Urinary Tract: Normal adrenal glands. Normal right kidney. Posterior exophytic interpolar left renal 2.3 cm lesion demonstrates T2 hyperintensity and mild precontrast T1 hyperintensity. No post-contrast enhancement, including on subtracted images. No hydronephrosis. Stomach/Bowel: Normal stomach and abdominal bowel loops. Vascular/Lymphatic: Aortic atherosclerosis. No abdominal adenopathy. Other:  No ascites. Musculoskeletal: No acute osseous abnormality. IMPRESSION: 1. The left renal lesion represents a hemorrhagic/proteinaceous cyst. 2.  Aortic Atherosclerosis (ICD10-I70.0). 3.  No acute abdominal process. Electronically Signed   By: Abigail Miyamoto M.D.   On: 09/07/2018 10:43   Nm Myocar Multi W/spect W/wall Motion / Ef  Result Date: 09/08/2018 CLINICAL DATA:  Heart catheterization 4-5 years ago demonstrated nonobstructive coronary artery disease. Patient presents with chest pain. EXAM: MYOCARDIAL IMAGING WITH SPECT (REST AND PHARMACOLOGIC-STRESS) GATED LEFT VENTRICULAR WALL MOTION STUDY LEFT VENTRICULAR EJECTION FRACTION TECHNIQUE: Standard myocardial SPECT imaging was performed after resting intravenous injection of 10 mCi Tc-19m tetrofosmin. Subsequently, intravenous infusion of Lexiscan was performed under the supervision of the Cardiology staff. At peak effect of the drug, 30 mCi Tc-62m tetrofosmin was injected intravenously and standard myocardial SPECT imaging was performed. Quantitative gated imaging was also performed to evaluate left ventricular wall motion, and estimate left ventricular ejection fraction. COMPARISON:  None. FINDINGS: Perfusion:  There is a small defect at the apex  which is slightly more prominent on stress images relative to rest images. The defect over the inferior wall extending into the inferolateral and inferior septal walls is thought to be diaphragmatic attenuation with normal thickening on gated images. The defect is slightly more prominent on stress images in the inferolateral wall towards the base. However, the rest images demonstrate subdiaphragmatic activity in this region and the slight apparent reversibility in the inferolateral wall towards the base is likely artifactual due to the difference in subdiaphragmatic activity. No other fixed defects identified. Wall Motion: Normal left ventricular wall motion. No left ventricular dilation. Left Ventricular Ejection Fraction: 59 % End diastolic volume 97 ml End systolic volume 40 ml IMPRESSION: 1. The mild defect in the apex is largely fixed suggesting a small infarct. There is mild associated reversibility which may represent a small region of mild ischemia. The defect over the inferior wall is thought to be due to diaphragmatic attenuation with normal thickening on gated images. There is apparent mild reversibility in the inferolateral wall towards the base but this is thought to be artifactual due to subdiaphragmatic activity in this region on rest images only. 2. Normal left ventricular wall motion. 3. Left ventricular ejection fraction 59% 4. Non invasive risk stratification*: Low *2012 Appropriate Use Criteria for Coronary Revascularization Focused Update: J Am Coll Cardiol. 8546;27(0):350-093. http://[email or web address] Electronically Signed   By: Dorise Bullion III M.D   On: 09/08/2018 17:35    Cardiac Studies:  Assessment/Plan:  Status post atypical chest pain with minimally elevated high sensitivity troponin are probably due to demand ischemia doubt significant MI Hypertension Remote tobacco abuse. Remote EtOH abuse. Anxiety disorder. Elevated blood  sugar. Plan Discussed with patient at length regarding nuclear stress test results as above and various options of treatment i.e. medical versus invasive left cath possible PTCA stenting its risk and benefits and agrees for medical management for now.  Will maximize antianginal medications if continues to have recurrent anginal chest pain we will schedule for cardiac cath as outpatient as appropriate. Continue aspirin statin calcium channel blocker and low-dose isosorbide mononitrate Sublingual nitro 0.4 mg every 5 minutes as needed/ Okay to discharge from cardiac point of view Follow-up with me in 1 to 2 weeks  LOS: 2 days    Charolette Forward 09/08/2018, 8:15 PM

## 2018-09-08 NOTE — Progress Notes (Signed)
PROGRESS NOTE    Stacy Sweeney  SWH:[id number] DOB: 1956/02/06 DOA: 09/06/2018 PCP: System, Pcp Not In    Brief Narrative:  63 year old female admitted to the hospital with atypical chest pain.  She had mild elevation of troponins.  EKG did not show any acute findings.  Admitted for further cardiac work-up.   Assessment & Plan:   Principal Problem:   NSTEMI (non-ST elevated myocardial infarction) ([employer]) Active Problems:   GAD (generalized anxiety disorder)   IgM monoclonal gammopathy of uncertain significance   Renal mass   Pulmonary nodules   1. Atypical chest pain with minimally elevated troponin. Seen by cardiology and underwent stress test this morning.  Results indicate area of reversible ischemia with a mild defect in the apex.  Discussed with Dr. Terrence Dupont and plan will be for cardiac cath in the morning.  We will keep n.p.o. after midnight..  Continue on aspirin, amlodipine and Lipitor.  We will add low-dose Imdur.  Heart rate is in the 60s, so she would not tolerate beta-blocker. 2. Possible renal mass.  Further evaluated by MRI and shown to be hemorrhagic cyst. 3. Pulmonary nodules.  Noted on CT chest.  Will need repeat CT in 3 to 6 months. 4. MGUS.  Followed by hematology at [facility]. 5. Anxiety.  Likely contributing to above symptoms.  Improved today after receiving a dose of Xanax.   DVT prophylaxis: Lovenox Code Status: Full code Family Communication: Discussed with patient Disposition Plan: Discharge home once cardiology work-up complete   Consultants:   Cardiology, Dr. Terrence Dupont  Procedures:  Echo: 1. The left ventricle has normal systolic function with an ejection fraction of 60-65%. The cavity size was normal. Left ventricular diastolic Doppler parameters are consistent with impaired relaxation. No evidence of left ventricular regional wall  motion abnormalities.  2. The right ventricle has normal systolic function. The cavity was normal. There is no increase in  right ventricular wall thickness. Right ventricular systolic pressure is mildly elevated with an estimated pressure of 39.1 mmHg.  3. The aortic valve is tricuspid. Aortic valve regurgitation is mild by color flow Doppler.   4. The interatrial septum was not assessed.  Antimicrobials:      Subjective: No chest pain or shortness of breath. Objective: Vitals:   09/08/18 0916 09/08/18 0918 09/08/18 1258 09/08/18 1639  BP: (!) 172/55 (!) 159/61 (!) 141/52 136/74  Pulse:   62 64  Resp:      Temp:   97.7 F (36.5 C) 97.7 F (36.5 C)  TempSrc:   Oral Oral  SpO2:   98% 99%  Weight:      Height:        Intake/Output Summary (Last 24 hours) at 09/08/2018 1800 Last data filed at 09/08/2018 1300 Gross per 24 hour  Intake 530 ml  Output --  Net 530 ml   Filed Weights   09/06/18 2152 09/07/18 0407 09/08/18 0527  Weight: 62.1 kg 61.7 kg 60.4 kg    Examination:  General exam: Alert, awake, oriented x 3 Respiratory system: Clear to auscultation. Respiratory effort normal. Cardiovascular system:RRR. No murmurs, rubs, gallops. Gastrointestinal system: Abdomen is nondistended, soft and nontender. No organomegaly or masses felt. Normal bowel sounds heard. Central nervous system: Alert and oriented. No focal neurological deficits. Extremities: No C/C/E, +pedal pulses Skin: No rashes, lesions or ulcers Psychiatry: Judgement and insight appear normal. Mood & affect appropriate.    Data Reviewed: I have personally reviewed following labs and imaging studies  CBC: Recent Labs  Lab 09/06/18 1429  WBC 9.3  NEUTROABS 5.2  HGB 12.0  HCT 37.0  MCV 88.3  PLT 382   Basic Metabolic Panel: Recent Labs  Lab 09/06/18 1429  NA 143  K 4.1  CL 107  CO2 22  GLUCOSE 104*  BUN 19  CREATININE 0.77  CALCIUM 9.8   GFR: Estimated Creatinine Clearance: 62.2 mL/min (by C-G formula based on SCr of 0.77 mg/dL). Liver Function Tests: No results for input(s): AST, ALT, ALKPHOS, BILITOT, PROT,  ALBUMIN in the last 168 hours. No results for input(s): LIPASE, AMYLASE in the last 168 hours. No results for input(s): AMMONIA in the last 168 hours. Coagulation Profile: No results for input(s): INR, PROTIME in the last 168 hours. Cardiac Enzymes: No results for input(s): CKTOTAL, CKMB, CKMBINDEX, TROPONINI in the last 168 hours. BNP (last 3 results) No results for input(s): PROBNP in the last 8760 hours. HbA1C: No results for input(s): HGBA1C in the last 72 hours. CBG: No results for input(s): GLUCAP in the last 168 hours. Lipid Profile: Recent Labs    09/06/18 2351  CHOL 172  HDL 64  LDLCALC 82  TRIG 128  CHOLHDL 2.7   Thyroid Function Tests: No results for input(s): TSH, T4TOTAL, FREET4, T3FREE, THYROIDAB in the last 72 hours. Anemia Panel: No results for input(s): VITAMINB12, FOLATE, FERRITIN, TIBC, IRON, RETICCTPCT in the last 72 hours. Sepsis Labs: No results for input(s): PROCALCITON, LATICACIDVEN in the last 168 hours.  Recent Results (from the past 240 hour(s))  SARS Coronavirus 2 (Hosp order,Performed in [facility] lab via [employer] ID)     Status: None   Collection Time: 09/06/18  6:12 PM   Specimen: Dry Nasal Swab ([employer] [facility] Now)  Result Value Ref Range Status   SARS Coronavirus 2 ([employer] Now) NEGATIVE NEGATIVE Final    Comment: (NOTE) SARS-CoV-2 target nucleic acids are NOT DETECTED. The SARS-CoV-2 RNA is generally detectable in upper and lower respiratory specimens during the acute phase of infection.  Negativeresults do not preclude SARS-CoV-2 infection, do not rule out coinfections with other pathogens, and should not be used as the  sole basis for treatment or other patient management decisions.  Negative results must be combined with clinical observations, patient history, and epidemiological information. The expected result is Negative. Fact Sheet for Patients: GolfingFamily.no Fact Sheet for Healthcare  Providers: https://www.hernandez-brewer.com/ This test is not yet approved or cleared by the [place] FDA and  has been authorized for detection and/or diagnosis of SARS-CoV-2 by FDA under an Emergency Use Authorization (EUA).  This EUA will remain in effect (meaning this test can be used) for the duration of  the COVID19 declaration under Section 5 64(b)(1) of the Act, 21 U.S.C.  section [phone] 3(b)(1), unless the authorization is terminated or revoked sooner. Performed at [facility], [address] [place], [place] [address]          Radiology Studies: Mr Abdomen W Wo Contrast  Result Date: 09/07/2018 CLINICAL DATA:  Recent admission for chest pain. CT chest demonstrating a left renal mass. EXAM: MRI ABDOMEN WITHOUT AND WITH CONTRAST TECHNIQUE: Multiplanar multisequence MR imaging of the abdomen was performed both before and after the administration of intravenous contrast. CONTRAST:  6 cc of Gadavist COMPARISON:  CT chest of 1 day prior FINDINGS: Portions of exam are minimally motion degraded. Lower chest: Normal heart size without pericardial or pleural effusion. Hepatobiliary: Normal liver. Normal gallbladder, without biliary ductal dilatation. Pancreas:  Normal, without mass or  ductal dilatation. Spleen:  Normal in size, without focal abnormality. Adrenals/Urinary Tract: Normal adrenal glands. Normal right kidney. Posterior exophytic interpolar left renal 2.3 cm lesion demonstrates T2 hyperintensity and mild precontrast T1 hyperintensity. No post-contrast enhancement, including on subtracted images. No hydronephrosis. Stomach/Bowel: Normal stomach and abdominal bowel loops. Vascular/Lymphatic: Aortic atherosclerosis. No abdominal adenopathy. Other:  No ascites. Musculoskeletal: No acute osseous abnormality. IMPRESSION: 1. The left renal lesion represents a hemorrhagic/proteinaceous cyst. 2.  Aortic Atherosclerosis (ICD10-I70.0). 3.  No acute abdominal process.  Electronically Signed   By: Abigail Miyamoto M.D.   On: 09/07/2018 10:43   Nm Myocar Multi W/spect W/wall Motion / Ef  Result Date: 09/08/2018 CLINICAL DATA:  Heart catheterization 4-5 years ago demonstrated nonobstructive coronary artery disease. Patient presents with chest pain. EXAM: MYOCARDIAL IMAGING WITH SPECT (REST AND PHARMACOLOGIC-STRESS) GATED LEFT VENTRICULAR WALL MOTION STUDY LEFT VENTRICULAR EJECTION FRACTION TECHNIQUE: Standard myocardial SPECT imaging was performed after resting intravenous injection of 10 mCi Tc-41m tetrofosmin. Subsequently, intravenous infusion of Lexiscan was performed under the supervision of the Cardiology staff. At peak effect of the drug, 30 mCi Tc-8m tetrofosmin was injected intravenously and standard myocardial SPECT imaging was performed. Quantitative gated imaging was also performed to evaluate left ventricular wall motion, and estimate left ventricular ejection fraction. COMPARISON:  None. FINDINGS: Perfusion: There is a small defect at the apex which is slightly more prominent on stress images relative to rest images. The defect over the inferior wall extending into the inferolateral and inferior septal walls is thought to be diaphragmatic attenuation with normal thickening on gated images. The defect is slightly more prominent on stress images in the inferolateral wall towards the base. However, the rest images demonstrate subdiaphragmatic activity in this region and the slight apparent reversibility in the inferolateral wall towards the base is likely artifactual due to the difference in subdiaphragmatic activity. No other fixed defects identified. Wall Motion: Normal left ventricular wall motion. No left ventricular dilation. Left Ventricular Ejection Fraction: 59 % End diastolic volume 97 ml End systolic volume 40 ml IMPRESSION: 1. The mild defect in the apex is largely fixed suggesting a small infarct. There is mild associated reversibility which may represent a  small region of mild ischemia. The defect over the inferior wall is thought to be due to diaphragmatic attenuation with normal thickening on gated images. There is apparent mild reversibility in the inferolateral wall towards the base but this is thought to be artifactual due to subdiaphragmatic activity in this region on rest images only. 2. Normal left ventricular wall motion. 3. Left ventricular ejection fraction 59% 4. Non invasive risk stratification*: Low *2012 Appropriate Use Criteria for Coronary Revascularization Focused Update: J Am Coll Cardiol. 3664;40(3):474-259. http://content.[email or web address] Electronically Signed   By: Dorise Bullion III M.D   On: 09/08/2018 17:35        Scheduled Meds:  amLODipine  2.5 mg Oral Daily   aspirin EC  81 mg Oral Daily   atorvastatin  40 mg Oral q1800   enoxaparin (LOVENOX) injection  60 mg Subcutaneous Q12H   isosorbide mononitrate  15 mg Oral Daily   regadenoson       Continuous Infusions:   LOS: 2 days    Time spent: 62mins    Kathie Dike, MD [facility]   If 7PM-7AM, please contact night-coverage [email or web address]  09/08/2018, 6:00 PM

## 2018-09-08 NOTE — Discharge Summary (Addendum)
Physician Discharge Summary  CINDIE RAJAGOPALAN GYB:[id number] DOB: [birth year] DOA: 09/06/2018  PCP: System, Pcp Not In  Admit date: 09/06/2018 Discharge date: 09/08/2018  Admitted From: home Disposition:  home  Recommendations for Outpatient Follow-up:  Follow up with PCP in 1-2 weeks Please obtain BMP/CBC in one week Follow up with Dr. Terrence Dupont in 1-2 weeks. If patient has recurrent chest pain, will consider cardiac cath Follow up with Dr. Joesph July at [facility] for follow up of pulmonary nodules. Needs repeat CT chest in 3-6 months  Discharge Condition: stable CODE STATUS: full code Diet recommendation: heart healthy  Brief/Interim Summary: 63 year old female admitted to the hospital with atypical chest pain.  She had mild elevation of troponins.  EKG did not show any acute findings.  Admitted for further cardiac work-up.  Discharge Diagnoses:  Principal Problem: Elevated troponin likely related to demand ischemia Active Problems:   GAD (generalized anxiety disorder)   IgM monoclonal gammopathy of uncertain significance   Renal mass   Pulmonary nodules  Atypical chest pain with minimally elevated troponin. Seen by cardiology and underwent stress test this morning.  Results indicate area of reversible ischemia with a mild defect in the apex.  Discussed with Dr. Terrence Dupont and initially plan was for cardiac cath, but after further review and discussion with patient, it was decided that medical management at this time would be appropriate. Patient will follow up with Dr. Terrence Dupont in the next 1-2 weeks. She is on asa, statin, CCB ,(no BB due to bradycardia), nitrates. If she has recurrent chest pain, can consider cardiac cath at that time..  Patient is agreeable with this plan. Possible renal mass.  Further evaluated by MRI and shown to be hemorrhagic cyst. Pulmonary nodules.  Noted on CT chest.  Will need repeat CT in 3 to 6 months. Follow up with her hem/onc at [facility].  Followed by hematology at  [facility]. Anxiety.  Likely contributing to above symptoms.  Improved after a dose of xanax  Discharge Instructions  Discharge Instructions     Diet - low sodium heart healthy   Complete by: As directed    Increase activity slowly   Complete by: As directed       Allergies as of 09/08/2018   No Active Allergies      Medication List     TAKE these medications    amLODipine 5 MG tablet Commonly known as: NORVASC Take 1 tablet (5 mg total) by mouth daily. Start taking on: September 09, 2018   aspirin 81 MG EC tablet Take 1 tablet (81 mg total) by mouth daily. Start taking on: September 09, 2018   atorvastatin 40 MG tablet Commonly known as: LIPITOR Take 1 tablet (40 mg total) by mouth daily at 6 PM. Start taking on: September 09, 2018   COLLAGEN PO Take 2 Scoops by mouth daily.   FLUoxetine 20 MG capsule Commonly known as: PROZAC Take 20 mg by mouth daily.   isosorbide mononitrate 30 MG 24 hr tablet Commonly known as: IMDUR Take 0.5 tablets (15 mg total) by mouth daily. Start taking on: September 09, 2018   nitroGLYCERIN 0.4 MG SL tablet Commonly known as: NITROSTAT Place 1 tablet (0.4 mg total) under the tongue every 5 (five) minutes as needed for chest pain.   Turmeric Curcumin 500 MG Caps Take 1 capsule by mouth daily.       Follow-up Information     Charolette Forward, MD. Schedule an appointment as soon as possible for a visit in  1 week(s).   Specialty: Cardiology Contact information: [place] [address] [phone]         Olegario Messier, MD. Schedule an appointment as soon as possible for a visit in 1 month(s).   Specialty: Hematology Why: follow up for lung nodules Contact information: [place] [address] [phone]           No Active Allergies  Consultations: cardiology   Procedures/Studies: Ct Head Wo Contrast  Result Date: 09/06/2018 CLINICAL DATA:  Severe headache.  Dizziness  and nausea.  No trauma. EXAM: CT HEAD WITHOUT CONTRAST TECHNIQUE: Contiguous axial images were obtained from the base of the skull through the vertex without intravenous contrast. COMPARISON:  07/01/2007 FINDINGS: Brain: Mild cerebral atrophy. Cerebellar atrophy is felt to be mildly age advanced. No mass lesion, hemorrhage, hydrocephalus, acute infarct, intra-axial, or extra-axial fluid collection. Vascular: No hyperdense vessel or unexpected calcification. Skull: Normal.  Minimal motion degradation inferiorly. Sinuses/Orbits: Normal imaged portions of the orbits and globes. Clear paranasal sinuses and mastoid air cells. Other: None. IMPRESSION: 1.  No acute intracranial abnormality. 2. Mild cerebral and cerebellar atrophy for age. Electronically Signed   By: Abigail Miyamoto M.D.   On: 09/06/2018 16:43   Ct Angio Chest Pe W And/or Wo Contrast  Result Date: 09/06/2018 CLINICAL DATA:  Chest and upper back pain.  Dyspnea. EXAM: CT ANGIOGRAPHY CHEST WITH CONTRAST TECHNIQUE: Multidetector CT imaging of the chest was performed using the standard protocol during bolus administration of intravenous contrast. Multiplanar CT image reconstructions and MIPs were obtained to evaluate the vascular anatomy. CONTRAST:  8mL OMNIPAQUE IOHEXOL 350 MG/ML SOLN COMPARISON:  Chest radiograph from earlier today. FINDINGS: Cardiovascular: The study is high quality for the evaluation of pulmonary embolism. There are no filling defects in the central, lobar, segmental or subsegmental pulmonary artery branches to suggest acute pulmonary embolism. Mildly atherosclerotic nonaneurysmal thoracic aorta. Normal caliber pulmonary arteries. Normal heart size. No significant pericardial fluid/thickening. Mediastinum/Nodes: No discrete thyroid nodules. Unremarkable esophagus. No pathologically enlarged axillary, mediastinal or hilar lymph nodes. Lungs/Pleura: No pneumothorax. No pleural effusion. Mild centrilobular and paraseptal emphysema with mild  diffuse bronchial wall thickening. No acute consolidative airspace disease or lung masses. Several scattered solid pulmonary nodules in both lungs, largest 7 mm in the left lower lobe (series 6/image 73). Hypoventilatory changes in the dependent lower lobes. Upper abdomen: Exophytic hypodense 2.5 cm renal cortical lesion in the posterior upper left kidney (series 4/image 99) with density 38 HU. Musculoskeletal: No aggressive appearing focal osseous lesions. Mild thoracic spondylosis. Review of the MIP images confirms the above findings. IMPRESSION: 1. No pulmonary embolism. 2. Exophytic indeterminate 2.5 cm renal cortical lesion in the posterior upper left kidney, cannot exclude renal cell carcinoma. Renal mass protocol MRI (preferred) or CT abdomen without and with IV contrast recommended for further characterization, which may be performed on a short term outpatient basis. 3. Scattered solid pulmonary nodules in both lungs, largest 7 mm. Non-contrast chest CT at 3-6 months is recommended. If the nodules are stable at time of repeat CT, then future CT at 18-24 months (from today's scan) is considered optional for low-risk patients, but is recommended for high-risk patients. This recommendation follows the consensus statement: Guidelines for Management of Incidental Pulmonary Nodules Detected on CT Images: From the Fleischner Society 2017; Radiology 2017; 284:228-243. Aortic Atherosclerosis (ICD10-I70.0) and Emphysema (ICD10-J43.9). Electronically Signed   By: Ilona Sorrel M.D.   On: 09/06/2018 16:58  Mr Abdomen W Wo Contrast  Result Date: 09/07/2018 CLINICAL DATA:  Recent admission for chest pain. CT chest demonstrating a left renal mass. EXAM: MRI ABDOMEN WITHOUT AND WITH CONTRAST TECHNIQUE: Multiplanar multisequence MR imaging of the abdomen was performed both before and after the administration of intravenous contrast. CONTRAST:  6 cc of Gadavist COMPARISON:  CT chest of 1 day prior FINDINGS: Portions of  exam are minimally motion degraded. Lower chest: Normal heart size without pericardial or pleural effusion. Hepatobiliary: Normal liver. Normal gallbladder, without biliary ductal dilatation. Pancreas:  Normal, without mass or ductal dilatation. Spleen:  Normal in size, without focal abnormality. Adrenals/Urinary Tract: Normal adrenal glands. Normal right kidney. Posterior exophytic interpolar left renal 2.3 cm lesion demonstrates T2 hyperintensity and mild precontrast T1 hyperintensity. No post-contrast enhancement, including on subtracted images. No hydronephrosis. Stomach/Bowel: Normal stomach and abdominal bowel loops. Vascular/Lymphatic: Aortic atherosclerosis. No abdominal adenopathy. Other:  No ascites. Musculoskeletal: No acute osseous abnormality. IMPRESSION: 1. The left renal lesion represents a hemorrhagic/proteinaceous cyst. 2.  Aortic Atherosclerosis (ICD10-I70.0). 3.  No acute abdominal process. Electronically Signed   By: Abigail Miyamoto M.D.   On: 09/07/2018 10:43   Nm Myocar Multi W/spect W/wall Motion / Ef  Result Date: 09/08/2018 CLINICAL DATA:  Heart catheterization 4-5 years ago demonstrated nonobstructive coronary artery disease. Patient presents with chest pain. EXAM: MYOCARDIAL IMAGING WITH SPECT (REST AND PHARMACOLOGIC-STRESS) GATED LEFT VENTRICULAR WALL MOTION STUDY LEFT VENTRICULAR EJECTION FRACTION TECHNIQUE: Standard myocardial SPECT imaging was performed after resting intravenous injection of 10 mCi Tc-48m tetrofosmin. Subsequently, intravenous infusion of Lexiscan was performed under the supervision of the Cardiology staff. At peak effect of the drug, 30 mCi Tc-46m tetrofosmin was injected intravenously and standard myocardial SPECT imaging was performed. Quantitative gated imaging was also performed to evaluate left ventricular wall motion, and estimate left ventricular ejection fraction. COMPARISON:  None. FINDINGS: Perfusion: There is a small defect at the apex which is slightly  more prominent on stress images relative to rest images. The defect over the inferior wall extending into the inferolateral and inferior septal walls is thought to be diaphragmatic attenuation with normal thickening on gated images. The defect is slightly more prominent on stress images in the inferolateral wall towards the base. However, the rest images demonstrate subdiaphragmatic activity in this region and the slight apparent reversibility in the inferolateral wall towards the base is likely artifactual due to the difference in subdiaphragmatic activity. No other fixed defects identified. Wall Motion: Normal left ventricular wall motion. No left ventricular dilation. Left Ventricular Ejection Fraction: 59 % End diastolic volume 97 ml End systolic volume 40 ml IMPRESSION: 1. The mild defect in the apex is largely fixed suggesting a small infarct. There is mild associated reversibility which may represent a small region of mild ischemia. The defect over the inferior wall is thought to be due to diaphragmatic attenuation with normal thickening on gated images. There is apparent mild reversibility in the inferolateral wall towards the base but this is thought to be artifactual due to subdiaphragmatic activity in this region on rest images only. 2. Normal left ventricular wall motion. 3. Left ventricular ejection fraction 59% 4. Non invasive risk stratification*: Low *2012 Appropriate Use Criteria for Coronary Revascularization Focused Update: J Am Coll Cardiol. 2330;07(6):226-333. http://content.airportbarriers.com.aspx?articleid=[email or web address] Electronically Signed   By: Dorise Bullion III M.D   On: 09/08/2018 17:35   Dg Chest Port 1 View  Result Date: 09/06/2018 CLINICAL DATA:  Patient with shortness of breath. EXAM: PORTABLE CHEST 1 VIEW COMPARISON:  None. FINDINGS: Normal cardiac and mediastinal contours. No consolidative pulmonary opacities. No pleural effusion or pneumothorax. IMPRESSION: No acute  cardiopulmonary process. Electronically Signed   By: Lovey Newcomer M.D.   On: 09/06/2018 14:39      Subjective: No further chest pain or shortness of breath  Discharge Exam: Vitals:   09/08/18 0918 09/08/18 1258 09/08/18 1639 09/08/18 2020  BP: (!) 159/61 (!) 141/52 136/74 (!) 146/49  Pulse:  62 64 (!) 55  Resp:    20  Temp:  97.7 F (36.5 C) 97.7 F (36.5 C) 97.7 F (36.5 C)  TempSrc:  Oral Oral Oral  SpO2:  98% 99% 97%  Weight:      Height:        General: Pt is alert, awake, not in acute distress Cardiovascular: RRR, S1/S2 +, no rubs, no gallops Respiratory: CTA bilaterally, no wheezing, no rhonchi Abdominal: Soft, NT, ND, bowel sounds + Extremities: no edema, no cyanosis    The results of significant diagnostics from this hospitalization (including imaging, microbiology, ancillary and laboratory) are listed below for reference.     Microbiology: Recent Results (from the past 240 hour(s))  SARS Coronavirus 2 (Hosp order,Performed in [facility] lab via [employer] ID)     Status: None   Collection Time: 09/06/18  6:12 PM   Specimen: Dry Nasal Swab (Abbott ID Now)  Result Value Ref Range Status   SARS Coronavirus 2 (Abbott ID Now) NEGATIVE NEGATIVE Final    Comment: (NOTE) SARS-CoV-2 target nucleic acids are NOT DETECTED. The SARS-CoV-2 RNA is generally detectable in upper and lower respiratory specimens during the acute phase of infection.  Negativeresults do not preclude SARS-CoV-2 infection, do not rule out coinfections with other pathogens, and should not be used as the  sole basis for treatment or other patient management decisions.  Negative results must be combined with clinical observations, patient history, and epidemiological information. The expected result is Negative. Fact Sheet for Patients: GolfingFamily.no Fact Sheet for Healthcare Providers: https://www.hernandez-brewer.com/ This test is not yet approved or cleared  by the [place] FDA and  has been authorized for detection and/or diagnosis of SARS-CoV-2 by FDA under an Emergency Use Authorization (EUA).  This EUA will remain in effect (meaning this test can be used) for the duration of  the COVID19 declaration under Section 5 64(b)(1) of the Act, 21 U.S.C.  section [phone] 3(b)(1), unless the authorization is terminated or revoked sooner. Performed at [facility], [address] [place], [place] [address]      Labs: BNP (last 3 results) No results for input(s): BNP in the last 8760 hours. Basic Metabolic Panel: Recent Labs  Lab 09/06/18 1429  NA 143  K 4.1  CL 107  CO2 22  GLUCOSE 104*  BUN 19  CREATININE 0.77  CALCIUM 9.8   Liver Function Tests: No results for input(s): AST, ALT, ALKPHOS, BILITOT, PROT, ALBUMIN in the last 168 hours. No results for input(s): LIPASE, AMYLASE in the last 168 hours. No results for input(s): AMMONIA in the last 168 hours. CBC: Recent Labs  Lab 09/06/18 1429  WBC 9.3  NEUTROABS 5.2  HGB 12.0  HCT 37.0  MCV 88.3  PLT 377   Cardiac Enzymes: No results for input(s): CKTOTAL, CKMB, CKMBINDEX, TROPONINI in the last 168 hours. BNP: Invalid input(s): POCBNP CBG: No results for input(s): GLUCAP in the last 168 hours. D-Dimer No results for input(s): DDIMER in the last 72 hours. Hgb A1c No results for input(s):  HGBA1C in the last 72 hours. Lipid Profile Recent Labs    09/06/18 2351  CHOL 172  HDL 64  LDLCALC 82  TRIG 128  CHOLHDL 2.7   Thyroid function studies No results for input(s): TSH, T4TOTAL, T3FREE, THYROIDAB in the last 72 hours.  Invalid input(s): FREET3 Anemia work up No results for input(s): VITAMINB12, FOLATE, FERRITIN, TIBC, IRON, RETICCTPCT in the last 72 hours. Urinalysis No results found for: COLORURINE, APPEARANCEUR, [place], [place], [place], [place], [place], [place], PROTEINUR, UROBILINOGEN, NITRITE, LEUKOCYTESUR Sepsis Labs Invalid input(s):  PROCALCITONIN,  WBC,  LACTICIDVEN Microbiology Recent Results (from the past 240 hour(s))  SARS Coronavirus 2 (Hosp order,Performed in [place] lab via [employer] ID)     Status: None   Collection Time: 09/06/18  6:12 PM   Specimen: Dry Nasal Swab (Abbott ID Now)  Result Value Ref Range Status   SARS Coronavirus 2 (Abbott ID Now) NEGATIVE NEGATIVE Final    Comment: (NOTE) SARS-CoV-2 target nucleic acids are NOT DETECTED. The SARS-CoV-2 RNA is generally detectable in upper and lower respiratory specimens during the acute phase of infection.  Negativeresults do not preclude SARS-CoV-2 infection, do not rule out coinfections with other pathogens, and should not be used as the  sole basis for treatment or other patient management decisions.  Negative results must be combined with clinical observations, patient history, and epidemiological information. The expected result is Negative. Fact Sheet for Patients: GolfingFamily.no Fact Sheet for Healthcare Providers: https://www.hernandez-brewer.com/ This test is not yet approved or cleared by the [place] FDA and  has been authorized for detection and/or diagnosis of SARS-CoV-2 by FDA under an Emergency Use Authorization (EUA).  This EUA will remain in effect (meaning this test can be used) for the duration of  the COVID19 declaration under Section 5 64(b)(1) of the Act, 21 U.S.C.  section [phone] 3(b)(1), unless the authorization is terminated or revoked sooner. Performed at [facility], [address] [place], [place] [address]      Time coordinating discharge: 13mins  SIGNED:   Kathie Dike, MD  [facility] 09/08/2018, 8:57 PM   If 7PM-7AM, please contact night-coverage [email or web address]

## 2018-09-08 NOTE — Progress Notes (Signed)
Discharge review done with patient. She acknowledged understanding of information provided. Patient is stable and being picked up by husband.

## 2018-09-09 LAB — HIV ANTIBODY (ROUTINE TESTING W REFLEX): HIV Screen 4th Generation wRfx: NONREACTIVE

## 2018-10-01 ENCOUNTER — Other Ambulatory Visit: Payer: Self-pay

## 2018-10-01 ENCOUNTER — Emergency Department (HOSPITAL_COMMUNITY): Payer: BLUE CROSS/BLUE SHIELD

## 2018-10-01 ENCOUNTER — Encounter (HOSPITAL_COMMUNITY): Payer: Self-pay | Admitting: Emergency Medicine

## 2018-10-01 ENCOUNTER — Emergency Department (HOSPITAL_COMMUNITY)
Admission: EM | Admit: 2018-10-01 | Discharge: 2018-10-01 | Disposition: A | Discharge status: Home / Self Care | Payer: BLUE CROSS/BLUE SHIELD | Attending: Emergency Medicine | Admitting: Emergency Medicine

## 2018-10-01 DIAGNOSIS — S40012A Contusion of left shoulder, initial encounter: Secondary | ICD-10-CM | POA: Diagnosis not present

## 2018-10-01 DIAGNOSIS — I252 Old myocardial infarction: Secondary | ICD-10-CM | POA: Insufficient documentation

## 2018-10-01 DIAGNOSIS — Y929 Unspecified place or not applicable: Secondary | ICD-10-CM | POA: Insufficient documentation

## 2018-10-01 DIAGNOSIS — Y999 Unspecified external cause status: Secondary | ICD-10-CM | POA: Insufficient documentation

## 2018-10-01 DIAGNOSIS — Z87891 Personal history of nicotine dependence: Secondary | ICD-10-CM | POA: Diagnosis not present

## 2018-10-01 DIAGNOSIS — Y939 Activity, unspecified: Secondary | ICD-10-CM | POA: Insufficient documentation

## 2018-10-01 DIAGNOSIS — Z79899 Other long term (current) drug therapy: Secondary | ICD-10-CM | POA: Insufficient documentation

## 2018-10-01 DIAGNOSIS — S161XXA Strain of muscle, fascia and tendon at neck level, initial encounter: Secondary | ICD-10-CM | POA: Diagnosis not present

## 2018-10-01 DIAGNOSIS — M542 Cervicalgia: Secondary | ICD-10-CM | POA: Diagnosis present

## 2018-10-01 DIAGNOSIS — Z7982 Long term (current) use of aspirin: Secondary | ICD-10-CM | POA: Insufficient documentation

## 2018-10-01 DIAGNOSIS — S20212A Contusion of left front wall of thorax, initial encounter: Secondary | ICD-10-CM | POA: Diagnosis not present

## 2018-10-01 LAB — RAPID URINE DRUG SCREEN, HOSP PERFORMED
Amphetamines: NOT DETECTED
Barbiturates: NOT DETECTED
Benzodiazepines: POSITIVE — AB
Cocaine: NOT DETECTED
Opiates: NOT DETECTED
Tetrahydrocannabinol: NOT DETECTED

## 2018-10-01 LAB — URINALYSIS, ROUTINE W REFLEX MICROSCOPIC
Bilirubin Urine: NEGATIVE
Glucose, UA: NEGATIVE mg/dL
Hgb urine dipstick: NEGATIVE
Ketones, ur: NEGATIVE mg/dL
Leukocytes,Ua: NEGATIVE
Nitrite: NEGATIVE
Protein, ur: NEGATIVE mg/dL
Specific Gravity, Urine: 1.003 — ABNORMAL LOW (ref 1.005–1.030)
pH: 6 (ref 5.0–8.0)

## 2018-10-01 LAB — CBC WITH DIFFERENTIAL/PLATELET
Abs Immature Granulocytes: 0.08 10*3/uL — ABNORMAL HIGH (ref 0.00–0.07)
Basophils Absolute: 0.1 10*3/uL (ref 0.0–0.1)
Basophils Relative: 1 %
Eosinophils Absolute: 0.3 10*3/uL (ref 0.0–0.5)
Eosinophils Relative: 3 %
HCT: 38.5 % (ref 36.0–46.0)
Hemoglobin: 12.4 g/dL (ref 12.0–15.0)
Immature Granulocytes: 1 %
Lymphocytes Relative: 23 %
Lymphs Abs: 2.8 10*3/uL (ref 0.7–4.0)
MCH: 28.9 pg (ref 26.0–34.0)
MCHC: 32.2 g/dL (ref 30.0–36.0)
MCV: 89.7 fL (ref 80.0–100.0)
Monocytes Absolute: 1.1 10*3/uL — ABNORMAL HIGH (ref 0.1–1.0)
Monocytes Relative: 9 %
Neutro Abs: 7.7 10*3/uL (ref 1.7–7.7)
Neutrophils Relative %: 63 %
Platelets: 416 10*3/uL — ABNORMAL HIGH (ref 150–400)
RBC: 4.29 MIL/uL (ref 3.87–5.11)
RDW: 12.5 % (ref 11.5–15.5)
WBC: 12.2 10*3/uL — ABNORMAL HIGH (ref 4.0–10.5)
nRBC: 0 % (ref 0.0–0.2)

## 2018-10-01 LAB — BASIC METABOLIC PANEL
Anion gap: 11 (ref 5–15)
BUN: 16 mg/dL (ref 8–23)
CO2: 22 mmol/L (ref 22–32)
Calcium: 9.2 mg/dL (ref 8.9–10.3)
Chloride: 109 mmol/L (ref 98–111)
Creatinine, Ser: 0.67 mg/dL (ref 0.44–1.00)
GFR calc Af Amer: >60 mL/min (ref >60)
GFR calc non Af Amer: >60 mL/min (ref >60)
Glucose, Bld: 98 mg/dL (ref 70–99)
Potassium: 3.9 mmol/L (ref 3.5–5.1)
Sodium: 142 mmol/L (ref 135–145)

## 2018-10-01 LAB — ETHANOL: Alcohol, Ethyl (B): <10 mg/dL (ref <10)

## 2018-10-01 MED ORDER — ACETAMINOPHEN 500 MG PO TABS
1000.0000 mg | ORAL_TABLET | Freq: Once | ORAL | Status: AC
  Administered 2018-10-01: 1000 mg via ORAL
  Filled 2018-10-01: qty 2

## 2018-10-01 NOTE — ED Notes (Signed)
Pt's husband Stacy Sweeney) called requesting a social work consult for his wife. States she sees a psychiatrist and has been off her medications for about a month. During this time husband states his wife is exhibiting odd behavior such as visiting multiple cardiologist for non essential reasons, having panic attacks, staying awake all night, and scheduling an elective surgery for their dog that would cost between [phone]. He clearly stated they are in debt currently for wife's medical bills and the surgery for their dog would not be affordable.  Pt's husband also states his wife tells the psychiatrist she is perfectly fine, even though husband feels she is a possible danger to herself and others.   EDP notified

## 2018-10-01 NOTE — ED Notes (Addendum)
Pt states that she spoke with her counselor Monica Martinez Komb [phone] with [facility]) and would prefer to not do TTS in ED, voices concern that she will need a ride home and feels badly for her husband waiting for her to go back to work despite husband stating it is fine for him to leave and come back if needed.

## 2018-10-01 NOTE — ED Notes (Signed)
Patient transported to X-ray 
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
## 2018-10-01 NOTE — ED Notes (Signed)
Pt states that she would like to leave, declines to speak to TTS in ED. MD aware, came to bedside and discussed d/c with pt.

## 2018-10-01 NOTE — ED Notes (Signed)
Pt contact (Son - Derl Barrow) [phone] (According to pt he is her medical POA).  Pt husband Nyellie Yetter) [phone]  Pt request we do not give updates to daughter Colletta [place])

## 2018-10-01 NOTE — Discharge Instructions (Addendum)
Tylenol 1000 mg every 6 hours as needed for pain.  Rest.  Apply ice to affected areas for 20 minutes every 2 hours while awake as needed for swelling.  Follow-up with your counselor this week, and return to the emergency department if you experience any new and/or concerning symptoms.

## 2018-10-01 NOTE — ED Notes (Signed)
Pt educated about not taking >4000 mg/day acetaminophen, aware that she was provided 1000 mg in ED
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
## 2021-07-04 DEATH — deceased
